# Patient Record
Sex: Female | Born: 1975 | Race: White | Hispanic: No | Marital: Married | Smoking: Never smoker
Health system: Southern US, Community
[De-identification: ages and names within clinical notes are randomized; demographics above are authoritative.]

## PROBLEM LIST (undated history)

## (undated) DIAGNOSIS — M40209 Unspecified kyphosis, site unspecified: Secondary | ICD-10-CM

## (undated) DIAGNOSIS — M419 Scoliosis, unspecified: Secondary | ICD-10-CM

## (undated) DIAGNOSIS — F909 Attention-deficit hyperactivity disorder, unspecified type: Secondary | ICD-10-CM

## (undated) DIAGNOSIS — H04129 Dry eye syndrome of unspecified lacrimal gland: Secondary | ICD-10-CM

## (undated) DIAGNOSIS — F32A Depression, unspecified: Secondary | ICD-10-CM

## (undated) DIAGNOSIS — F329 Major depressive disorder, single episode, unspecified: Secondary | ICD-10-CM

## (undated) DIAGNOSIS — J45909 Unspecified asthma, uncomplicated: Secondary | ICD-10-CM

## (undated) HISTORY — DX: Attention-deficit hyperactivity disorder, unspecified type: F90.9

## (undated) HISTORY — DX: Major depressive disorder, single episode, unspecified: F32.9

## (undated) HISTORY — DX: Unspecified kyphosis, site unspecified: M40.209

## (undated) HISTORY — DX: Scoliosis, unspecified: M41.9

## (undated) HISTORY — DX: Unspecified asthma, uncomplicated: J45.909

## (undated) HISTORY — DX: Dry eye syndrome of unspecified lacrimal gland: H04.129

## (undated) HISTORY — DX: Depression, unspecified: F32.A

---

## 2005-01-14 ENCOUNTER — Other Ambulatory Visit: Admission: RE | Admit: 2005-01-14 | Discharge: 2005-01-14 | Payer: Self-pay | Admitting: Obstetrics and Gynecology

## 2009-05-03 ENCOUNTER — Inpatient Hospital Stay (HOSPITAL_COMMUNITY): Admission: RE | Admit: 2009-05-03 | Discharge: 2009-05-06 | Payer: Self-pay | Admitting: Obstetrics and Gynecology

## 2010-04-30 LAB — CBC
HCT: 31.9 % — ABNORMAL LOW (ref 36.0–46.0)
Hemoglobin: 11.3 g/dL — ABNORMAL LOW (ref 12.0–15.0)
Hemoglobin: 13.1 g/dL (ref 12.0–15.0)
MCHC: 34.9 g/dL (ref 30.0–36.0)
MCV: 97.3 fL (ref 78.0–100.0)
Platelets: 147 10*3/uL — ABNORMAL LOW (ref 150–400)
RBC: 3.28 MIL/uL — ABNORMAL LOW (ref 3.87–5.11)
RBC: 3.86 MIL/uL — ABNORMAL LOW (ref 3.87–5.11)
WBC: 8.6 10*3/uL (ref 4.0–10.5)
WBC: 9.3 10*3/uL (ref 4.0–10.5)

## 2010-04-30 LAB — RPR: RPR Ser Ql: NONREACTIVE

## 2015-09-25 ENCOUNTER — Encounter: Payer: Self-pay | Admitting: Physician Assistant

## 2015-09-25 ENCOUNTER — Ambulatory Visit: Payer: Self-pay | Admitting: Physician Assistant

## 2015-09-25 VITALS — BP 120/88 | HR 82 | Temp 98.8°F

## 2015-09-25 DIAGNOSIS — J209 Acute bronchitis, unspecified: Secondary | ICD-10-CM

## 2015-09-25 MED ORDER — METHYLPREDNISOLONE 4 MG PO TBPK: ORAL_TABLET | ORAL | 0 refills | Status: DC

## 2015-09-25 NOTE — Progress Notes (Signed)
S: c/o some cough and congestion with ?wheezing last night, sx for 4 days, used an old inhaler and felt better, no cp/sob, no edema in legs/ankles/feet; thought it was allergies at first, just had a bad night last night  O: vitals wnl, nad, ENT wnl neck supple no lymph, lungs c t a, cv rrr  A: acute bronchitis  P: continue inhaler, add medrol dose pack, if not improved in a few days will call in an antibiotic

## 2015-09-26 ENCOUNTER — Other Ambulatory Visit: Payer: Self-pay | Admitting: Physician Assistant

## 2015-09-26 MED ORDER — ALBUTEROL SULFATE HFA 108 (90 BASE) MCG/ACT IN AERS: 2.0000 | INHALATION_SPRAY | Freq: Four times a day (QID) | RESPIRATORY_TRACT | 0 refills | Status: DC | PRN

## 2015-09-26 NOTE — Telephone Encounter (Signed)
Pt called and needs inhaler, rx sent to [facility]

## 2015-10-03 ENCOUNTER — Encounter: Payer: Self-pay | Admitting: Physician Assistant

## 2015-10-03 ENCOUNTER — Ambulatory Visit
Admission: RE | Admit: 2015-10-03 | Discharge: 2015-10-03 | Disposition: A | Discharge status: Home / Self Care | Payer: 59 | Source: Ambulatory Visit | Attending: Physician Assistant | Admitting: Physician Assistant

## 2015-10-03 ENCOUNTER — Ambulatory Visit: Payer: Self-pay | Admitting: Physician Assistant

## 2015-10-03 VITALS — BP 120/82 | HR 76 | Temp 98.5°F

## 2015-10-03 DIAGNOSIS — R059 Cough, unspecified: Secondary | ICD-10-CM

## 2015-10-03 DIAGNOSIS — R05 Cough: Secondary | ICD-10-CM

## 2015-10-03 DIAGNOSIS — J209 Acute bronchitis, unspecified: Secondary | ICD-10-CM

## 2015-10-03 MED ORDER — FLUTICASONE-SALMETEROL 115-21 MCG/ACT IN AERO: 2.0000 | INHALATION_SPRAY | Freq: Two times a day (BID) | RESPIRATORY_TRACT | 12 refills | Status: DC

## 2015-10-03 MED ORDER — AZITHROMYCIN 250 MG PO TABS: ORAL_TABLET | ORAL | 0 refills | Status: DC

## 2015-10-03 MED ORDER — PREDNISONE 10 MG (48) PO TBPK: ORAL_TABLET | Freq: Every day | ORAL | 0 refills | Status: DC

## 2015-10-03 MED ORDER — IPRATROPIUM-ALBUTEROL 0.5-2.5 (3) MG/3ML IN SOLN: 3.0000 mL | Freq: Four times a day (QID) | RESPIRATORY_TRACT | Status: DC

## 2015-10-03 NOTE — Addendum Note (Signed)
Addended by: Versie Starks on: 10/03/2015 12:11 PM   Modules accepted: Orders

## 2015-10-03 NOTE — Progress Notes (Addendum)
S: C/o cough and congestion with wheezing, hears crackling on left side when she lies down, finished steroids, denies  fever, chills, states cough is dry and hacking; keeping pt awake at night;  denies cardiac type chest pain or sob, v/d, abd pain Remainder ros neg  O: vitals wnl, nad, , neck supple no lymph, lungs with wheezing, cv rrr, neuro intact, svn duoneb given, still wheezing  A:  Acute bronchitis, ?pneumonia   P:  Cxr,  use otc meds, tylenol or motrin as needed for fever/chills, return if not better in 3 -5 days, return earlier if worsening, will call inmeds after cxr result  Xray was normal, sent rx for zpack, advair, and sterapred, pt to return in 2 days for recheck

## 2015-11-06 DIAGNOSIS — Z30433 Encounter for removal and reinsertion of intrauterine contraceptive device: Secondary | ICD-10-CM | POA: Diagnosis not present

## 2015-11-06 DIAGNOSIS — Z3200 Encounter for pregnancy test, result unknown: Secondary | ICD-10-CM | POA: Diagnosis not present

## 2015-11-27 DIAGNOSIS — Z01419 Encounter for gynecological examination (general) (routine) without abnormal findings: Secondary | ICD-10-CM | POA: Diagnosis not present

## 2015-11-27 DIAGNOSIS — Z6833 Body mass index (BMI) 33.0-33.9, adult: Secondary | ICD-10-CM | POA: Diagnosis not present

## 2015-12-21 DIAGNOSIS — Z131 Encounter for screening for diabetes mellitus: Secondary | ICD-10-CM | POA: Diagnosis not present

## 2015-12-21 DIAGNOSIS — Z1321 Encounter for screening for nutritional disorder: Secondary | ICD-10-CM | POA: Diagnosis not present

## 2015-12-21 DIAGNOSIS — Z13220 Encounter for screening for lipoid disorders: Secondary | ICD-10-CM | POA: Diagnosis not present

## 2015-12-21 DIAGNOSIS — Z1329 Encounter for screening for other suspected endocrine disorder: Secondary | ICD-10-CM | POA: Diagnosis not present

## 2016-05-06 DIAGNOSIS — R079 Chest pain, unspecified: Secondary | ICD-10-CM | POA: Diagnosis not present

## 2016-05-06 DIAGNOSIS — R1084 Generalized abdominal pain: Secondary | ICD-10-CM | POA: Diagnosis not present

## 2016-05-22 ENCOUNTER — Ambulatory Visit (INDEPENDENT_AMBULATORY_CARE_PROVIDER_SITE_OTHER): Payer: 59

## 2016-05-22 ENCOUNTER — Ambulatory Visit (INDEPENDENT_AMBULATORY_CARE_PROVIDER_SITE_OTHER): Payer: 59 | Admitting: Orthopedic Surgery

## 2016-05-22 ENCOUNTER — Encounter (INDEPENDENT_AMBULATORY_CARE_PROVIDER_SITE_OTHER): Payer: Self-pay | Admitting: Orthopedic Surgery

## 2016-05-22 DIAGNOSIS — M25561 Pain in right knee: Secondary | ICD-10-CM | POA: Diagnosis not present

## 2016-05-22 DIAGNOSIS — M546 Pain in thoracic spine: Secondary | ICD-10-CM | POA: Diagnosis not present

## 2016-05-24 NOTE — Progress Notes (Signed)
Office Visit Note   Patient: Sharon Carey           Date of Birth: [birth year]           MRN: [id number] Visit Date: 05/22/2016 Requested by: No referring provider defined for this encounter. PCP: No PCP Per Patient  Subjective: Chief Complaint  Patient presents with  . Right Knee - Pain  . Middle Back - Pain    HPI: Sharon Carey is a 41 year old patient with a year history of intermittent right-sided mid thoracic back pain.  This started years ago when she was bending over trying to pull her boots up and she felt a pop in her back.  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  At times she feels pulling when she turns to the right.  She takes ibuprofen to a day.  She does describe some numbness around her neck.  She has significant pain in the side whenever she turns or twists.  Whenever she coughs or sneezes she has increased pain.  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  Unclear she's ever had an MRI scan.  Symptoms been worse for the past year but have been present for 2 years.  Patient also describes on off right knee pain which comes and goes without any discrete injury.  She describes swelling but no weakness giving way locking or popping.  Showing reports pain with certain movement.              ROS: All systems reviewed are negative as they relate to the chief complaint within the history of present illness.  Patient denies  fevers or chills.   Assessment & Plan: Visit Diagnoses:  1. Pain in thoracic spine   2. Right knee pain, unspecified chronicity     Plan:  Impression is right-sided thoracic pain with fairly significant kyphosis on lateral radiograph.  I think she's having enough radiating type pain that she would potentially benefit from injections.  Plan is for MRI thoracic spine to evaluate right-sided disc.  She is also having a lot of chest wall pain to direct palpation.  This is been going on  over a year and with normal radiographs in that area I would favor chest CT to evaluate that chest wall region.  Hard to know if this is just some type of atypical rib subluxation long-standing muscle sprain versus strain or a thoracic disc on the right-hand side.  She localizes the pain most discretely on the lateral side of the lower rib cage.   Follow-Up Instructions: No Follow-up on file.   Orders:  Orders Placed This Encounter  Procedures  . XR KNEE 3 VIEW RIGHT  . XR Thoracic Spine 2 View  . CT CHEST WO CONTRAST  . MR THORACIC SPINE WO CONTRAST   No orders of the defined types were placed in this encounter.     Procedures: No procedures performed   Clinical Data: No additional findings.  Objective: Vital Signs: There were no vitals taken for this visit.  Physical Exam:  Constitutional: Patient appears well-developed HEENT:  Head: Normocephalic Eyes:EOM are normal Neck: Normal range of motion Cardiovascular: Normal rate Pulmonary/chest: Effort normal Neurologic: Patient is alert Skin: Skin is warm Psychiatric: Patient has normal mood and affect   Ortho Exam: Orthopedic exam demonstrates reasonable right knee range of motion with stable clamp initially was no effusion no joint line tenderness extensor mechanism is nontender  and intact pedal pulses palpable no other masses lymph adenopathy or skin changes noted in the right knee region  Patient has no Babinski no clonus.  Palpable pedal pulses.  Some pain with forward lateral bending.  No skin changes or rashes on the back.  Does have tenderness to direct palpation around the right lateral ninth and 10th rib.  No scapular dyskinesia with forward flexion.  Specialty Comments:  No specialty comments available.  Imaging: No results found.   PMFS History: There are no active problems to display for this patient.  No past medical history on file.  No family history on file.  No past surgical history on  file. Social History   Occupational History  . Not on file.   Social History Main Topics  . Smoking status: Never Smoker  . Smokeless tobacco: Never Used  . Alcohol use Not on file  . Drug use: Unknown  . Sexual activity: Not on file

## 2016-06-03 ENCOUNTER — Ambulatory Visit
Admission: RE | Admit: 2016-06-03 | Discharge: 2016-06-03 | Disposition: A | Discharge status: Home / Self Care | Payer: 59 | Source: Ambulatory Visit | Attending: Orthopedic Surgery | Admitting: Orthopedic Surgery

## 2016-06-03 DIAGNOSIS — M546 Pain in thoracic spine: Secondary | ICD-10-CM

## 2016-06-03 DIAGNOSIS — R079 Chest pain, unspecified: Secondary | ICD-10-CM | POA: Diagnosis not present

## 2016-06-03 DIAGNOSIS — M4184 Other forms of scoliosis, thoracic region: Secondary | ICD-10-CM | POA: Diagnosis not present

## 2016-06-10 ENCOUNTER — Ambulatory Visit (INDEPENDENT_AMBULATORY_CARE_PROVIDER_SITE_OTHER): Payer: 59 | Admitting: Orthopedic Surgery

## 2016-06-10 DIAGNOSIS — R591 Generalized enlarged lymph nodes: Secondary | ICD-10-CM | POA: Diagnosis not present

## 2016-06-10 DIAGNOSIS — M546 Pain in thoracic spine: Secondary | ICD-10-CM

## 2016-06-12 ENCOUNTER — Encounter (INDEPENDENT_AMBULATORY_CARE_PROVIDER_SITE_OTHER): Payer: Self-pay | Admitting: Orthopedic Surgery

## 2016-06-12 ENCOUNTER — Other Ambulatory Visit: Payer: Self-pay | Admitting: Obstetrics and Gynecology

## 2016-06-12 DIAGNOSIS — R591 Generalized enlarged lymph nodes: Secondary | ICD-10-CM

## 2016-06-12 DIAGNOSIS — N63 Unspecified lump in unspecified breast: Secondary | ICD-10-CM

## 2016-06-12 NOTE — Progress Notes (Signed)
   Office Visit Note   Patient: Sharon Carey           Date of Birth: [birth year]           MRN: [id number] Visit Date: 06/10/2016 Requested by: No referring provider defined for this encounter. PCP: Louretta Shorten, MD  Subjective: Chief Complaint  Patient presents with  . Middle Back - Pain    HPI: Sharon Carey is a 41 year old female with back pain and right-sided chest pain.  Since of Sharon Carey she's had MRI of the thoracic spine.  That scan is reviewed with her today and it does show T5-6 H&P.  CT scan also shows some lymphadenopathy in the axillary region and breast workup is recommended.  She does have some pain in that chest wall with sleep.  CT scan was otherwise negative for any type of rib fracture.  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  Patient denies  fevers or chills.   Assessment & Plan: Visit Diagnoses:  1. Pain in thoracic spine   2. Lymphadenopathy     Plan: Impression is right sided T5-6 H&P which I think is giving her her describes symptoms.  We reviewed the MRI scan.  She could consider epidural start injections and if she wants to do that she will call and I can arrange this with Dr. Marlou Sa.  More pressing is mammogram and breast workup for this unexplained lymphadenopathy in the chest wall area.  I will see her back as needed we are going to send her to Dr. Natale Milch for this evaluation.  He is her primary care provider.  Follow-Up Instructions: Return if symptoms worsen or fail to improve.   Orders:  Orders Placed This Encounter  Procedures  . Ambulatory referral to Gynecology   No orders of the defined types were placed in this encounter.     Procedures: No procedures performed   Clinical Data: No additional findings.  Objective: Vital Signs: There were no vitals taken for this visit.  Physical Exam:   Constitutional:  Patient appears well-developed HEENT:  Head: Normocephalic Eyes:EOM are normal Neck: Normal range of motion Cardiovascular: Normal rate Pulmonary/chest: Effort normal Neurologic: Patient is alert Skin: Skin is warm Psychiatric: Patient has normal mood and affect    Ortho Exam: Orthopedic exam demonstrates pain with forward lateral bending no rashes motor sensory function to the legs is intact rest of her examination is unchanged  Specialty Comments:  No specialty comments available.  Imaging: No results found.   PMFS History: There are no active problems to display for this patient.  No past medical history on file.  No family history on file.  No past surgical history on file. Social History   Occupational History  . Not on file.   Social History Main Topics  . Smoking status: Never Smoker  . Smokeless tobacco: Never Used  . Alcohol use Not on file  . Drug use: Unknown  . Sexual activity: Not on file

## 2016-06-14 ENCOUNTER — Ambulatory Visit
Admission: RE | Admit: 2016-06-14 | Discharge: 2016-06-14 | Disposition: A | Discharge status: Home / Self Care | Payer: 59 | Source: Ambulatory Visit | Attending: Obstetrics and Gynecology | Admitting: Obstetrics and Gynecology

## 2016-06-14 ENCOUNTER — Other Ambulatory Visit: Payer: Self-pay | Admitting: Obstetrics and Gynecology

## 2016-06-14 DIAGNOSIS — N6489 Other specified disorders of breast: Secondary | ICD-10-CM | POA: Diagnosis not present

## 2016-06-14 DIAGNOSIS — R591 Generalized enlarged lymph nodes: Secondary | ICD-10-CM

## 2016-06-14 DIAGNOSIS — R599 Enlarged lymph nodes, unspecified: Secondary | ICD-10-CM

## 2016-06-14 DIAGNOSIS — R928 Other abnormal and inconclusive findings on diagnostic imaging of breast: Secondary | ICD-10-CM | POA: Diagnosis not present

## 2016-06-20 ENCOUNTER — Other Ambulatory Visit: Payer: Self-pay | Admitting: Obstetrics and Gynecology

## 2016-06-20 ENCOUNTER — Ambulatory Visit
Admission: RE | Admit: 2016-06-20 | Discharge: 2016-06-20 | Disposition: A | Discharge status: Home / Self Care | Payer: 59 | Source: Ambulatory Visit | Attending: Obstetrics and Gynecology | Admitting: Obstetrics and Gynecology

## 2016-06-20 DIAGNOSIS — R590 Localized enlarged lymph nodes: Secondary | ICD-10-CM | POA: Diagnosis not present

## 2016-06-20 DIAGNOSIS — R599 Enlarged lymph nodes, unspecified: Secondary | ICD-10-CM

## 2016-06-21 ENCOUNTER — Other Ambulatory Visit (HOSPITAL_COMMUNITY)
Admission: RE | Admit: 2016-06-21 | Discharge: 2016-06-21 | Disposition: A | Discharge status: Home / Self Care | Payer: 59 | Source: Other Acute Inpatient Hospital | Attending: Diagnostic Radiology | Admitting: Diagnostic Radiology

## 2016-06-21 DIAGNOSIS — R590 Localized enlarged lymph nodes: Secondary | ICD-10-CM | POA: Diagnosis not present

## 2016-08-13 ENCOUNTER — Other Ambulatory Visit: Payer: Self-pay | Admitting: Obstetrics and Gynecology

## 2016-08-13 DIAGNOSIS — R590 Localized enlarged lymph nodes: Secondary | ICD-10-CM

## 2016-09-27 DIAGNOSIS — H16223 Keratoconjunctivitis sicca, not specified as Sjogren's, bilateral: Secondary | ICD-10-CM | POA: Diagnosis not present

## 2016-10-03 DIAGNOSIS — H16223 Keratoconjunctivitis sicca, not specified as Sjogren's, bilateral: Secondary | ICD-10-CM | POA: Diagnosis not present

## 2016-12-03 DIAGNOSIS — Z01419 Encounter for gynecological examination (general) (routine) without abnormal findings: Secondary | ICD-10-CM | POA: Diagnosis not present

## 2016-12-03 DIAGNOSIS — H04129 Dry eye syndrome of unspecified lacrimal gland: Secondary | ICD-10-CM | POA: Diagnosis not present

## 2016-12-03 DIAGNOSIS — Z6832 Body mass index (BMI) 32.0-32.9, adult: Secondary | ICD-10-CM | POA: Diagnosis not present

## 2017-01-02 ENCOUNTER — Other Ambulatory Visit: Payer: Self-pay

## 2017-01-02 DIAGNOSIS — H16223 Keratoconjunctivitis sicca, not specified as Sjogren's, bilateral: Secondary | ICD-10-CM | POA: Diagnosis not present

## 2017-01-03 ENCOUNTER — Ambulatory Visit
Admission: RE | Admit: 2017-01-03 | Discharge: 2017-01-03 | Disposition: A | Discharge status: Home / Self Care | Payer: 59 | Source: Ambulatory Visit | Attending: Obstetrics and Gynecology | Admitting: Obstetrics and Gynecology

## 2017-01-03 DIAGNOSIS — R590 Localized enlarged lymph nodes: Secondary | ICD-10-CM

## 2017-01-03 DIAGNOSIS — N6489 Other specified disorders of breast: Secondary | ICD-10-CM | POA: Diagnosis not present

## 2017-05-15 ENCOUNTER — Encounter: Payer: Self-pay | Admitting: Family Medicine

## 2017-05-15 ENCOUNTER — Ambulatory Visit: Payer: 59 | Admitting: Family Medicine

## 2017-05-15 VITALS — BP 118/80 | HR 90 | Ht 63.39 in | Wt 187.5 lb

## 2017-05-15 DIAGNOSIS — J4520 Mild intermittent asthma, uncomplicated: Secondary | ICD-10-CM

## 2017-05-15 DIAGNOSIS — Z0000 Encounter for general adult medical examination without abnormal findings: Secondary | ICD-10-CM | POA: Insufficient documentation

## 2017-05-15 DIAGNOSIS — J301 Allergic rhinitis due to pollen: Secondary | ICD-10-CM | POA: Diagnosis not present

## 2017-05-15 DIAGNOSIS — Z0001 Encounter for general adult medical examination with abnormal findings: Secondary | ICD-10-CM | POA: Diagnosis not present

## 2017-05-15 DIAGNOSIS — J45909 Unspecified asthma, uncomplicated: Secondary | ICD-10-CM | POA: Insufficient documentation

## 2017-05-15 DIAGNOSIS — R079 Chest pain, unspecified: Secondary | ICD-10-CM | POA: Insufficient documentation

## 2017-05-15 DIAGNOSIS — M546 Pain in thoracic spine: Secondary | ICD-10-CM

## 2017-05-15 DIAGNOSIS — D229 Melanocytic nevi, unspecified: Secondary | ICD-10-CM | POA: Diagnosis not present

## 2017-05-15 MED ORDER — ALBUTEROL SULFATE HFA 108 (90 BASE) MCG/ACT IN AERS: 2.0000 | INHALATION_SPRAY | Freq: Four times a day (QID) | RESPIRATORY_TRACT | 3 refills | Status: DC | PRN

## 2017-05-15 MED ORDER — FLUTICASONE PROPIONATE 50 MCG/ACT NA SUSP: 2.0000 | Freq: Every day | NASAL | 6 refills | Status: DC

## 2017-05-15 MED ORDER — METHOCARBAMOL 500 MG PO TABS: 500.0000 mg | ORAL_TABLET | Freq: Three times a day (TID) | ORAL | 0 refills | Status: DC | PRN

## 2017-05-15 NOTE — Patient Instructions (Signed)
Back Exercises The following exercises strengthen the muscles that help to support the back. They also help to keep the lower back flexible. Doing these exercises can help to prevent back pain or lessen existing pain. If you have back pain or discomfort, try doing these exercises 2-3 times each day or as told by your health care provider. When the pain goes away, do them once each day, but increase the number of times that you repeat the steps for each exercise (do more repetitions). If you do not have back pain or discomfort, do these exercises once each day or as told by your health care provider. Exercises Single Knee to Chest  Repeat these steps 3-5 times for each leg: 1. Lie on your back on a firm bed or the floor with your legs extended. 2. Bring one knee to your chest. Your other leg should stay extended and in contact with the floor. 3. Hold your knee in place by grabbing your knee or thigh. 4. Pull on your knee until you feel a gentle stretch in your lower back. 5. Hold the stretch for 10-30 seconds. 6. Slowly release and straighten your leg.  Pelvic Tilt  Repeat these steps 5-10 times: 1. Lie on your back on a firm bed or the floor with your legs extended. 2. Bend your knees so they are pointing toward the ceiling and your feet are flat on the floor. 3. Tighten your lower abdominal muscles to press your lower back against the floor. This motion will tilt your pelvis so your tailbone points up toward the ceiling instead of pointing to your feet or the floor. 4. With gentle tension and even breathing, hold this position for 5-10 seconds.  Cat-Cow  Repeat these steps until your lower back becomes more flexible: 1. Get into a hands-and-knees position on a firm surface. Keep your hands under your shoulders, and keep your knees under your hips. You may place padding under your knees for comfort. 2. Let your head hang down, and point your tailbone toward the floor so your lower back  becomes rounded like the back of a cat. 3. Hold this position for 5 seconds. 4. Slowly lift your head and point your tailbone up toward the ceiling so your back forms a sagging arch like the back of a cow. 5. Hold this position for 5 seconds.  Press-Ups  Repeat these steps 5-10 times: 1. Lie on your abdomen (face-down) on the floor. 2. Place your palms near your head, about shoulder-width apart. 3. While you keep your back as relaxed as possible and keep your hips on the floor, slowly straighten your arms to raise the top half of your body and lift your shoulders. Do not use your back muscles to raise your upper torso. You may adjust the placement of your hands to make yourself more comfortable. 4. Hold this position for 5 seconds while you keep your back relaxed. 5. Slowly return to lying flat on the floor.  Bridges  Repeat these steps 10 times: 1. Lie on your back on a firm surface. 2. Bend your knees so they are pointing toward the ceiling and your feet are flat on the floor. 3. Tighten your buttocks muscles and lift your buttocks off of the floor until your waist is at almost the same height as your knees. You should feel the muscles working in your buttocks and the back of your thighs. If you do not feel these muscles, slide your feet 1-2 inches farther away  from your buttocks. 4. Hold this position for 3-5 seconds. 5. Slowly lower your hips to the starting position, and allow your buttocks muscles to relax completely.  If this exercise is too easy, try doing it with your arms crossed over your chest. Abdominal Crunches  Repeat these steps 5-10 times: 1. Lie on your back on a firm bed or the floor with your legs extended. 2. Bend your knees so they are pointing toward the ceiling and your feet are flat on the floor. 3. Cross your arms over your chest. 4. Tip your chin slightly toward your chest without bending your neck. 5. Tighten your abdominal muscles and slowly raise your  trunk (torso) high enough to lift your shoulder blades a tiny bit off of the floor. Avoid raising your torso higher than that, because it can put too much stress on your low back and it does not help to strengthen your abdominal muscles. 6. Slowly return to your starting position.  Back Lifts Repeat these steps 5-10 times: 1. Lie on your abdomen (face-down) with your arms at your sides, and rest your forehead on the floor. 2. Tighten the muscles in your legs and your buttocks. 3. Slowly lift your chest off of the floor while you keep your hips pressed to the floor. Keep the back of your head in line with the curve in your back. Your eyes should be looking at the floor. 4. Hold this position for 3-5 seconds. 5. Slowly return to your starting position.  Contact a health care provider if:  Your back pain or discomfort gets much worse when you do an exercise.  Your back pain or discomfort does not lessen within 2 hours after you exercise. If you have any of these problems, stop doing these exercises right away. Do not do them again unless your health care provider says that you can. Get help right away if:  You develop sudden, severe back pain. If this happens, stop doing the exercises right away. Do not do them again unless your health care provider says that you can. This information is not intended to replace advice given to you by your health care provider. Make sure you discuss any questions you have with your health care provider. Document Released: 02/29/2004 Document Revised: 05/31/2015 Document Reviewed: 03/17/2014 Elsevier Interactive Patient Education  2017 [place] Maintenance, Female Adopting a healthy lifestyle and getting preventive care can go a long way to promote health and wellness. Talk with your health care provider about what schedule of regular examinations is right for you. This is a good chance for you to check in with your provider about disease prevention  and staying healthy. In between checkups, there are plenty of things you can do on your own. Experts have done a lot of research about which lifestyle changes and preventive measures are most likely to keep you healthy. Ask your health care provider for more information. Weight and diet Eat a healthy diet  Be sure to include plenty of vegetables, fruits, low-fat dairy products, and lean protein.  Do not eat a lot of foods high in solid fats, added sugars, or salt.  Get regular exercise. This is one of the most important things you can do for your health. ? Most adults should exercise for at least 150 minutes each week. The exercise should increase your heart rate and make you sweat (moderate-intensity exercise). ? Most adults should also do strengthening exercises at least twice a week. This is in addition  to the moderate-intensity exercise.  Maintain a healthy weight  Body mass index (BMI) is a measurement that can be used to identify possible weight problems. It estimates body fat based on height and weight. Your health care provider can help determine your BMI and help you achieve or maintain a healthy weight.  For females 8 years of age and older: ? A BMI below 18.5 is considered underweight. ? A BMI of 18.5 to 24.9 is normal. ? A BMI of 25 to 29.9 is considered overweight. ? A BMI of 30 and above is considered obese.  Watch levels of cholesterol and blood lipids  You should start having your blood tested for lipids and cholesterol at 41 years of age, then have this test every 5 years.  You may need to have your cholesterol levels checked more often if: ? Your lipid or cholesterol levels are high. ? You are older than 41 years of age. ? You are at high risk for heart disease.  Cancer screening Lung Cancer  Lung cancer screening is recommended for adults 54-26 years old who are at high risk for lung cancer because of a history of smoking.  A yearly low-dose CT scan of the  lungs is recommended for people who: ? Currently smoke. ? Have quit within the past 15 years. ? Have at least a 30-pack-year history of smoking. A pack year is smoking an average of one pack of cigarettes a day for 1 year.  Yearly screening should continue until it has been 15 years since you quit.  Yearly screening should stop if you develop a health problem that would prevent you from having lung cancer treatment.  Breast Cancer  Practice breast self-awareness. This means understanding how your breasts normally appear and feel.  It also means doing regular breast self-exams. Let your health care provider know about any changes, no matter how small.  If you are in your 20s or 30s, you should have a clinical breast exam (CBE) by a health care provider every 1-3 years as part of a regular health exam.  If you are 6 or older, have a CBE every year. Also consider having a breast X-ray (mammogram) every year.  If you have a family history of breast cancer, talk to your health care provider about genetic screening.  If you are at high risk for breast cancer, talk to your health care provider about having an MRI and a mammogram every year.  Breast cancer gene (BRCA) assessment is recommended for women who have family members with BRCA-related cancers. BRCA-related cancers include: ? Breast. ? Ovarian. ? Tubal. ? Peritoneal cancers.  Results of the assessment will determine the need for genetic counseling and BRCA1 and BRCA2 testing.  Cervical Cancer Your health care provider may recommend that you be screened regularly for cancer of the pelvic organs (ovaries, uterus, and vagina). This screening involves a pelvic examination, including checking for microscopic changes to the surface of your cervix (Pap test). You may be encouraged to have this screening done every 3 years, beginning at age 75.  For women ages 35-65, health care providers may recommend pelvic exams and Pap testing every 3  years, or they may recommend the Pap and pelvic exam, combined with testing for human papilloma virus (HPV), every 5 years. Some types of HPV increase your risk of cervical cancer. Testing for HPV may also be done on women of any age with unclear Pap test results.  Other health care providers may not recommend  any screening for nonpregnant women who are considered low risk for pelvic cancer and who do not have symptoms. Ask your health care provider if a screening pelvic exam is right for you.  If you have had past treatment for cervical cancer or a condition that could lead to cancer, you need Pap tests and screening for cancer for at least 20 years after your treatment. If Pap tests have been discontinued, your risk factors (such as having a new sexual partner) need to be reassessed to determine if screening should resume. Some women have medical problems that increase the chance of getting cervical cancer. In these cases, your health care provider may recommend more frequent screening and Pap tests.  Colorectal Cancer  This type of cancer can be detected and often prevented.  Routine colorectal cancer screening usually begins at 41 years of age and continues through 41 years of age.  Your health care provider may recommend screening at an earlier age if you have risk factors for colon cancer.  Your health care provider may also recommend using home test kits to check for hidden blood in the stool.  A small camera at the end of a tube can be used to examine your colon directly (sigmoidoscopy or colonoscopy). This is done to check for the earliest forms of colorectal cancer.  Routine screening usually begins at age 34.  Direct examination of the colon should be repeated every 5-10 years through 41 years of age. However, you may need to be screened more often if early forms of precancerous polyps or small growths are found.  Skin Cancer  Check your skin from head to toe regularly.  Tell  your health care provider about any new moles or changes in moles, especially if there is a change in a mole's shape or color.  Also tell your health care provider if you have a mole that is larger than the size of a pencil eraser.  Always use sunscreen. Apply sunscreen liberally and repeatedly throughout the day.  Protect yourself by wearing long sleeves, pants, a wide-brimmed hat, and sunglasses whenever you are outside.  Heart disease, diabetes, and high blood pressure  High blood pressure causes heart disease and increases the risk of stroke. High blood pressure is more likely to develop in: ? People who have blood pressure in the high end of the normal range (130-139/85-89 mm Hg). ? People who are overweight or obese. ? People who are African American.  If you are 31-44 years of age, have your blood pressure checked every 3-5 years. If you are 72 years of age or older, have your blood pressure checked every year. You should have your blood pressure measured twice-once when you are at a hospital or clinic, and once when you are not at a hospital or clinic. Record the average of the two measurements. To check your blood pressure when you are not at a hospital or clinic, you can use: ? An automated blood pressure machine at a pharmacy. ? A home blood pressure monitor.  If you are between 65 years and 69 years old, ask your health care provider if you should take aspirin to prevent strokes.  Have regular diabetes screenings. This involves taking a blood sample to check your fasting blood sugar level. ? If you are at a normal weight and have a low risk for diabetes, have this test once every three years after 41 years of age. ? If you are overweight and have a high risk for  diabetes, consider being tested at a younger age or more often. Preventing infection Hepatitis B  If you have a higher risk for hepatitis B, you should be screened for this virus. You are considered at high risk for  hepatitis B if: ? You were born in a country where hepatitis B is common. Ask your health care provider which countries are considered high risk. ? Your parents were born in a high-risk country, and you have not been immunized against hepatitis B (hepatitis B vaccine). ? You have HIV or AIDS. ? You use needles to inject street drugs. ? You live with someone who has hepatitis B. ? You have had sex with someone who has hepatitis B. ? You get hemodialysis treatment. ? You take certain medicines for conditions, including cancer, organ transplantation, and autoimmune conditions.  Hepatitis C  Blood testing is recommended for: ? Everyone born from [birth year] through 1964. ? Anyone with known risk factors for hepatitis C.  Sexually transmitted infections (STIs)  You should be screened for sexually transmitted infections (STIs) including gonorrhea and chlamydia if: ? You are sexually active and are younger than 41 years of age. ? You are older than 41 years of age and your health care provider tells you that you are at risk for this type of infection. ? Your sexual activity has changed since you were last screened and you are at an increased risk for chlamydia or gonorrhea. Ask your health care provider if you are at risk.  If you do not have HIV, but are at risk, it may be recommended that you take a prescription medicine daily to prevent HIV infection. This is called pre-exposure prophylaxis (PrEP). You are considered at risk if: ? You are sexually active and do not regularly use condoms or know the HIV status of your partner(s). ? You take drugs by injection. ? You are sexually active with a partner who has HIV.  Talk with your health care provider about whether you are at high risk of being infected with HIV. If you choose to begin PrEP, you should first be tested for HIV. You should then be tested every 3 months for as long as you are taking PrEP. Pregnancy  If you are premenopausal and you may  become pregnant, ask your health care provider about preconception counseling.  If you may become pregnant, take 400 to 800 micrograms (mcg) of folic acid every day.  If you want to prevent pregnancy, talk to your health care provider about birth control (contraception). Osteoporosis and menopause  Osteoporosis is a disease in which the bones lose minerals and strength with aging. This can result in serious bone fractures. Your risk for osteoporosis can be identified using a bone density scan.  If you are 71 years of age or older, or if you are at risk for osteoporosis and fractures, ask your health care provider if you should be screened.  Ask your health care provider whether you should take a calcium or vitamin D supplement to lower your risk for osteoporosis.  Menopause may have certain physical symptoms and risks.  Hormone replacement therapy may reduce some of these symptoms and risks. Talk to your health care provider about whether hormone replacement therapy is right for you. Follow these instructions at home:  Schedule regular health, dental, and eye exams.  Stay current with your immunizations.  Do not use any tobacco products including cigarettes, chewing tobacco, or electronic cigarettes.  If you are pregnant, do not drink alcohol.  If you are breastfeeding, limit how much and how often you drink alcohol.  Limit alcohol intake to no more than 1 drink per day for nonpregnant women. One drink equals 12 ounces of beer, 5 ounces of wine, or 1 ounces of hard liquor.  Do not use street drugs.  Do not share needles.  Ask your health care provider for help if you need support or information about quitting drugs.  Tell your health care provider if you often feel depressed.  Tell your health care provider if you have ever been abused or do not feel safe at home. This information is not intended to replace advice given to you by your health care provider. Make sure you  discuss any questions you have with your health care provider. Document Released: 08/06/2010 Document Revised: 06/29/2015 Document Reviewed: 10/25/2014 Elsevier Interactive Patient Education  2018 [employer].  How to Increase Your Level of Physical Activity Getting regular physical activity is important for your overall health and well-being. Most people do not get enough exercise. There are easy ways to increase your level of physical activity, even if you have not been very active in the past or you are just starting out. Why is physical activity important? Physical activity has many short-term and long-term health benefits. Regular exercise can:  Help you lose weight or maintain a healthy weight.  Strengthen your muscles and bones.  Boost your mood and improve self-esteem.  Reduce your risk of certain long-term (chronic) diseases, like heart disease, cancer, and diabetes.  Help you stay capable of walking and moving around (mobile) as you age.  Prevent accidents, such as falls, as you age.  Increase life expectancy.  What are the benefits of being physically active on a regular basis? In addition to improving your physical health, being physically active on most days of the week can help you in ways that you may not expect. Benefits of regular physical activity may include:  Feeling good about your body.  Being able to move around more easily and for longer periods of time without getting tired (increased stamina).  Finding new sources of fun and enjoyment.  Meeting new people who share a common interest.  Being able to fight off illness better (enhanced immunity).  Being able to sleep better.  What can happen if I am not physically active on a regular basis? Not getting enough physical activity can lead to an unhealthy lifestyle and future health problems. This can increase your chances of:  Becoming overweight or obese.  Becoming sick.  Developing chronic  illnesses, like heart disease or diabetes.  Having mental health problems, like depression or anxiety.  Having sleep problems.  Having trouble walking or getting yourself around (reduced mobility).  Injuring yourself in a fall as you get older.  What steps can I take to be more physically active?  Check with your health care provider about how to get started. Ask your health care provider what activities are safe for you.  Start out slowly. Walking or doing some simple chair exercises is a good place to start, especially if you have not been active before or for a long time.  Try to find activities that you enjoy. You are more likely to commit to an exercise routine if it does not feel like a chore.  If you have bone or joint problems, choose low-impact exercises, like walking or swimming.  Include physical activity in your everyday routine.  Invite friends or family members to exercise with  you. This also will help you commit to your workout plan.  Set goals that you can work toward.  Aim for at least 150 minutes of moderate-intensity exercise each week. Examples of moderate-intensity exercise include walking or riding a bike. Where to find more information:  Centers for Disease Control and Prevention: [employer]is  [employer] on [employer], Sports & Nutrition www.http://villegas.org/  ChooseMyPlate: [employer] Contact a health care provider if:  You have headaches, muscle aches, or joint pain.  You feel dizzy or light-headed while exercising.  You faint.  You have chest pain while exercising. Summary  Exercise benefits your mind and body at any age, even if you are just starting out.  If you have a chronic illness or have not been active for a while, check with your health care provider before increasing your physical activity.  Choose activities that are safe and enjoyable for you.Ask your health  care provider what activities are safe for you.  Start slowly. Tell your health care provider if you have problems as you start to increase your activity level. This information is not intended to replace advice given to you by your health care provider. Make sure you discuss any questions you have with your health care provider. Document Released: 01/11/2016 Document Revised: 01/11/2016 Document Reviewed: 01/11/2016 Elsevier Interactive Patient Education  2018 [place]  Mole A mole is a colored (pigmented) growth on the skin. Moles are very common. They are usually harmless, but some moles can become cancerous over time. What are the causes? Moles occur when pigmented skin cells grow together in clusters instead of spreading out in the skin as they normally do. The reason why the skin cells grow together in clusters is not known. What are the signs or symptoms? A mole may be:  [place] or black.  Flat or raised.  Smooth or wrinkled.  How is this diagnosed? A mole is diagnosed with a skin exam. If your health care provider thinks a mole may be cancerous, a piece of the mole will be removed for testing. How is this treated? Treatment is not needed unless a mole is cancerous. If a mole is cancerous, it will be removed. If a mole is causing pain or you do not like the way it looks, you may choose to have it removed. Follow these instructions at home:  Every month, look for new moles and check your existing moles for changes. This is important because a change in a mole can mean that the mole has become cancerous. Look for changes in: ? Size. Look for moles that are more than  in (0.64 cm) wide (in diameter). ? Shape. Look for moles that are not round or oval. ? Borders. Look for moles that are not symmetrical. ? Color. Note that it is normal for moles to get darker during pregnancy or when you take birth control pills.  When you are outdoors, wear sunscreen with SPF 30 (sun  protection factor 30) or higher. Reapply the sunscreen every 2-3 hours.  If you have a large number of moles, see a skin doctor (dermatologist) at least one time every year. Contact a health care provider if:  The size, shape, borders, or color of your mole change.  Your mole, or the skin near the mole, becomes painful, sore, red, or swollen.  Your mole: ? Develops more than one color. ? Itches or bleeds. ? Becomes scaly, sheds skin, or oozes fluid. ? Becomes flat or develops raised areas. ? Becomes hard or  soft.  You develop a new mole. This information is not intended to replace advice given to you by your health care provider. Make sure you discuss any questions you have with your health care provider. Document Released: 10/16/2000 Document Revised: 07/05/2015 Document Reviewed: 11/11/2014 Elsevier Interactive Patient Education  2018 [place] Years, Female Preventive care refers to lifestyle choices and visits with your health care provider that can promote health and wellness. What does preventive care include?  A yearly physical exam. This is also called an annual well check.  Dental exams once or twice a year.  Routine eye exams. Ask your health care provider how often you should have your eyes checked.  Personal lifestyle choices, including: ? Daily care of your teeth and gums. ? Regular physical activity. ? Eating a healthy diet. ? Avoiding tobacco and drug use. ? Limiting alcohol use. ? Practicing safe sex. ? Taking low-dose aspirin daily starting at age 36. ? Taking vitamin and mineral supplements as recommended by your health care provider. What happens during an annual well check? The services and screenings done by your health care provider during your annual well check will depend on your age, overall health, lifestyle risk factors, and family history of disease. Counseling Your health care provider may ask you questions about  your:  Alcohol use.  Tobacco use.  Drug use.  Emotional well-being.  Home and relationship well-being.  Sexual activity.  Eating habits.  Work and work Statistician.  Method of birth control.  Menstrual cycle.  Pregnancy history.  Screening You may have the following tests or measurements:  Height, weight, and BMI.  Blood pressure.  Lipid and cholesterol levels. These may be checked every 5 years, or more frequently if you are over 42 years old.  Skin check.  Lung cancer screening. You may have this screening every year starting at age 28 if you have a 30-pack-year history of smoking and currently smoke or have quit within the past 15 years.  Fecal occult blood test (FOBT) of the stool. You may have this test every year starting at age 3.  Flexible sigmoidoscopy or colonoscopy. You may have a sigmoidoscopy every 5 years or a colonoscopy every 10 years starting at age 2.  Hepatitis C blood test.  Hepatitis B blood test.  Sexually transmitted disease (STD) testing.  Diabetes screening. This is done by checking your blood sugar (glucose) after you have not eaten for a while (fasting). You may have this done every 1-3 years.  Mammogram. This may be done every 1-2 years. Talk to your health care provider about when you should start having regular mammograms. This may depend on whether you have a family history of breast cancer.  BRCA-related cancer screening. This may be done if you have a family history of breast, ovarian, tubal, or peritoneal cancers.  Pelvic exam and Pap test. This may be done every 3 years starting at age 37. Starting at age 25, this may be done every 5 years if you have a Pap test in combination with an HPV test.  Bone density scan. This is done to screen for osteoporosis. You may have this scan if you are at high risk for osteoporosis.  Discuss your test results, treatment options, and if necessary, the need for more tests with your health  care provider. Vaccines Your health care provider may recommend certain vaccines, such as:  Influenza vaccine. This is recommended every year.  Tetanus, diphtheria, and acellular pertussis (Tdap,  Td) vaccine. You may need a Td booster every 10 years.  Varicella vaccine. You may need this if you have not been vaccinated.  Zoster vaccine. You may need this after age 14.  Measles, mumps, and rubella (MMR) vaccine. You may need at least one dose of MMR if you were born in 1957 or later. You may also need a second dose.  Pneumococcal 13-valent conjugate (PCV13) vaccine. You may need this if you have certain conditions and were not previously vaccinated.  Pneumococcal polysaccharide (PPSV23) vaccine. You may need one or two doses if you smoke cigarettes or if you have certain conditions.  Meningococcal vaccine. You may need this if you have certain conditions.  Hepatitis A vaccine. You may need this if you have certain conditions or if you travel or work in places where you may be exposed to hepatitis A.  Hepatitis B vaccine. You may need this if you have certain conditions or if you travel or work in places where you may be exposed to hepatitis B.  Haemophilus influenzae type b (Hib) vaccine. You may need this if you have certain conditions.  Talk to your health care provider about which screenings and vaccines you need and how often you need them. This information is not intended to replace advice given to you by your health care provider. Make sure you discuss any questions you have with your health care provider. Document Released: 02/17/2015 Document Revised: 10/11/2015 Document Reviewed: 11/22/2014 Elsevier Interactive Patient Education  [employer].

## 2017-05-15 NOTE — Progress Notes (Signed)
Subjective:  Patient ID: Sharon Carey, female    DOB: 05-28-1975  Age: 43 y.o. MRN: [id number]  CC: Establish Care   HPI Sharon Carey presents for establishment of care and a routine physical exam.  She has a mole above her right knee she wants me to take a look at.  She also has a history of scoliosis with herniated thoracic disks.  He deals with periodic debilitating muscle spasms that she treats with stretching exercises.  She has Flexeril but cannot tolerated sedation.  She has a history of ADHD that is not treated medically at this time.  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  She is married and has 45 64 year old twin sons.  She has an 37-year-old son as well.  She is her husband have started an exercise routine at home.  She has a history of reactive airway disease and allergy rhinitis that are well treated with occasional albuterol use and Flonase.  They are both hoping to lose some weight.  Her husband snores loudly and she finds it difficult to sleep well at night on account of this issue.  Her parents are in their late 35s.  Dad is in excellent health but mom had an MI at age 51.  She had been smoking at that time.  She is now doing quite well.  Patient is 4 hours fasting today.  Her past lipid profiles that showed a mildly elevated LDL with an elevated HDL.  Blood sugars have always been normal.  History Tom has no past medical history on file.   She has no past surgical history on file.   Her family history is not on file.She reports that she has never smoked. She has never used smokeless tobacco. Her alcohol and drug histories are not on file.  Outpatient Medications Prior to Visit  Medication Sig Dispense Refill  . bimatoprost (LATISSE) 0.03 % ophthalmic solution Place 1 drop into both eyes at bedtime.    Marland Kitchen levonorgestrel (MIRENA) 20 MCG/24HR IUD 1 each by Intrauterine route once.    Marland Kitchen XIIDRA 5 % SOLN Apply 1 drop to eye daily.  6  . albuterol (PROVENTIL  HFA;VENTOLIN HFA) 108 (90 Base) MCG/ACT inhaler Inhale 2 puffs into the lungs every 6 (six) hours as needed for wheezing or shortness of breath. 1 Inhaler 0  . azithromycin (ZITHROMAX Z-PAK) 250 MG tablet 2 pills today then 1 pill a day for 4 days 6 each 0  . fluticasone-salmeterol (ADVAIR HFA) 115-21 MCG/ACT inhaler Inhale 2 puffs into the lungs 2 (two) times daily. 1 Inhaler 12  . methylPREDNISolone (MEDROL DOSEPAK) 4 MG TBPK tablet Take 6 pills on day one then decrease by 1 pill each day (Patient not taking: Reported on 10/03/2015) 21 tablet 0  . predniSONE (STERAPRED UNI-PAK 48 TAB) 10 MG (48) TBPK tablet Take by mouth daily. Use as directed 48 tablet 0  . ipratropium-albuterol (DUONEB) 0.5-2.5 (3) MG/3ML nebulizer solution 3 mL      No facility-administered medications prior to visit.     ROS Review of Systems  Constitutional: Negative for chills, fever and unexpected weight change.  HENT: Positive for postnasal drip, rhinorrhea and sinus pain.   Eyes: Negative for photophobia and visual disturbance.  Respiratory: Negative.   Cardiovascular: Negative.   Gastrointestinal: Negative.   Endocrine: Negative for polyphagia and polyuria.  Genitourinary: Negative.   Musculoskeletal: Positive for back pain and myalgias.  Skin: Positive for color change. Negative for rash and  wound.  Allergic/Immunologic: Negative for immunocompromised state.  Neurological: Negative for weakness and headaches.  Hematological: Does not bruise/bleed easily.  Psychiatric/Behavioral: Negative.     Objective:  BP 118/80 (BP Location: Left Arm, Patient Position: Sitting, Cuff Size: Normal)   Pulse 90   Ht 5' 3.39" (1.61 m)   Wt 187 lb 8 oz (85 kg)   SpO2 98%   BMI 32.81 kg/m   Physical Exam  Constitutional: She is oriented to person, place, and time. She appears well-developed and well-nourished. No distress.  HENT:  Head: Normocephalic and atraumatic.  Right Ear: External ear normal.  Left Ear:  External ear normal.  Nose: Nose normal.  Mouth/Throat: Oropharynx is clear and moist. No oropharyngeal exudate.  Eyes: Pupils are equal, round, and reactive to light. Conjunctivae and EOM are normal. Right eye exhibits no discharge. Left eye exhibits no discharge. No scleral icterus.  Neck: Normal range of motion. No JVD present. No tracheal deviation present. No thyromegaly present.  Cardiovascular: Normal rate, regular rhythm and normal heart sounds.  Pulmonary/Chest: Effort normal and breath sounds normal. No stridor. No respiratory distress. She has no wheezes.  Abdominal: Bowel sounds are normal.  Lymphadenopathy:    She has no cervical adenopathy.  Neurological: She is alert and oriented to person, place, and time.  Skin: Skin is warm and dry. Capillary refill takes less than 2 seconds. No rash noted. She is not diaphoretic. No erythema. No pallor.  Lateral distal anterior thigh is a 5 mm lesion with color variegation, asymmetry, faint inflammatory border and a waxy stuck on appearance.  Psychiatric: She has a normal mood and affect. Her behavior is normal.      Assessment & Plan:   Sharon Carey was seen today for establish care.  Diagnoses and all orders for this visit:  Thoracic back pain, unspecified back pain laterality, unspecified chronicity -     methocarbamol (ROBAXIN) 500 MG tablet; Take 1 tablet (500 mg total) by mouth every 8 (eight) hours as needed for muscle spasms.  Atypical nevi -     Ambulatory referral to Dermatology  Encounter for health maintenance examination with abnormal findings -     CBC -     Comprehensive metabolic panel -     HIV antibody -     Lipid panel -     TSH -     Urinalysis, Routine w reflex microscopic  Mild intermittent reactive airway disease without complication -     albuterol (PROVENTIL HFA;VENTOLIN HFA) 108 (90 Base) MCG/ACT inhaler; Inhale 2 puffs into the lungs every 6 (six) hours as needed for wheezing or shortness of  breath.  Seasonal allergic rhinitis due to pollen -     fluticasone (FLONASE) 50 MCG/ACT nasal spray; Place 2 sprays into both nostrils daily.   I have discontinued Sharon Carey's methylPREDNISolone, azithromycin, fluticasone-salmeterol, and predniSONE. I am also having her start on methocarbamol and fluticasone. Additionally, I am having her maintain her levonorgestrel, XIIDRA, bimatoprost, and albuterol. We will stop administering ipratropium-albuterol.  Meds ordered this encounter  Medications  . methocarbamol (ROBAXIN) 500 MG tablet    Sig: Take 1 tablet (500 mg total) by mouth every 8 (eight) hours as needed for muscle spasms.    Dispense:  60 tablet    Refill:  0  . albuterol (PROVENTIL HFA;VENTOLIN HFA) 108 (90 Base) MCG/ACT inhaler    Sig: Inhale 2 puffs into the lungs every 6 (six) hours as needed for wheezing or shortness of breath.  Dispense:  1 Inhaler    Refill:  3  . fluticasone (FLONASE) 50 MCG/ACT nasal spray    Sig: Place 2 sprays into both nostrils daily.    Dispense:  16 g    Refill:  6   Patient information was given concerning: Health maintenance and preventive care.  She was given information regarding atypical moles and back pain.  Encouraged her to bring her husband in for an apnea evaluation and referral for sleep study.  She will try her best to do this.   Follow-up: Return in about 3 months (around 08/14/2017).  Libby Maw, MD

## 2017-05-16 ENCOUNTER — Encounter: Payer: Self-pay | Admitting: Family Medicine

## 2017-05-16 LAB — URINALYSIS, ROUTINE W REFLEX MICROSCOPIC
Bilirubin Urine: NEGATIVE
Ketones, ur: NEGATIVE
LEUKOCYTES UA: NEGATIVE
NITRITE: NEGATIVE
Specific Gravity, Urine: <=1.005 — AB (ref 1.000–1.030)
Total Protein, Urine: NEGATIVE
Urine Glucose: NEGATIVE
Urobilinogen, UA: 0.2 (ref 0.0–1.0)
WBC UA: NONE SEEN (ref 0–?)
pH: 6 (ref 5.0–8.0)

## 2017-05-16 LAB — CBC
HEMATOCRIT: 39.8 % (ref 36.0–46.0)
Hemoglobin: 13.8 g/dL (ref 12.0–15.0)
MCHC: 34.6 g/dL (ref 30.0–36.0)
MCV: 91.8 fl (ref 78.0–100.0)
PLATELETS: 174 10*3/uL (ref 150.0–400.0)
RBC: 4.34 Mil/uL (ref 3.87–5.11)
RDW: 13.5 % (ref 11.5–15.5)
WBC: 4 10*3/uL (ref 4.0–10.5)

## 2017-05-16 LAB — COMPREHENSIVE METABOLIC PANEL
ALT: 17 U/L (ref 0–35)
AST: 20 U/L (ref 0–37)
Albumin: 4.4 g/dL (ref 3.5–5.2)
Alkaline Phosphatase: 70 U/L (ref 39–117)
BUN: 14 mg/dL (ref 6–23)
CALCIUM: 9.2 mg/dL (ref 8.4–10.5)
CHLORIDE: 104 meq/L (ref 96–112)
CO2: 25 meq/L (ref 19–32)
CREATININE: 0.85 mg/dL (ref 0.40–1.20)
GFR: 78.6 mL/min (ref >60.00)
Glucose, Bld: 86 mg/dL (ref 70–99)
POTASSIUM: 3.9 meq/L (ref 3.5–5.1)
Sodium: 137 mEq/L (ref 135–145)
Total Bilirubin: 0.4 mg/dL (ref 0.2–1.2)
Total Protein: 7.1 g/dL (ref 6.0–8.3)

## 2017-05-16 LAB — LIPID PANEL
CHOLESTEROL: 217 mg/dL — AB (ref 0–200)
HDL: 54.4 mg/dL (ref >39.00)
LDL CALC: 144 mg/dL — AB (ref 0–99)
NonHDL: 162.57
TRIGLYCERIDES: 93 mg/dL (ref 0.0–149.0)
Total CHOL/HDL Ratio: 4
VLDL: 18.6 mg/dL (ref 0.0–40.0)

## 2017-05-16 LAB — HIV ANTIBODY (ROUTINE TESTING W REFLEX): HIV: NONREACTIVE

## 2017-05-16 LAB — TSH: TSH: 2.29 u[IU]/mL (ref 0.35–4.50)

## 2017-06-24 ENCOUNTER — Encounter: Payer: Self-pay | Admitting: Adult Health

## 2017-06-24 ENCOUNTER — Ambulatory Visit (INDEPENDENT_AMBULATORY_CARE_PROVIDER_SITE_OTHER): Payer: Self-pay | Admitting: Adult Health

## 2017-06-24 VITALS — BP 124/90 | HR 77 | Temp 98.5°F | Wt 182.0 lb

## 2017-06-24 DIAGNOSIS — H60501 Unspecified acute noninfective otitis externa, right ear: Secondary | ICD-10-CM

## 2017-06-24 DIAGNOSIS — H6690 Otitis media, unspecified, unspecified ear: Secondary | ICD-10-CM

## 2017-06-24 DIAGNOSIS — H6981 Other specified disorders of Eustachian tube, right ear: Secondary | ICD-10-CM

## 2017-06-24 MED ORDER — NEOMYCIN-POLYMYXIN-HC 3.5-10000-1 OT SOLN: 3.0000 [drp] | Freq: Three times a day (TID) | OTIC | 0 refills | Status: DC

## 2017-06-24 MED ORDER — PREDNISONE 10 MG (21) PO TBPK: ORAL_TABLET | ORAL | 0 refills | Status: DC

## 2017-06-24 MED ORDER — CEFDINIR 300 MG PO CAPS: 300.0000 mg | ORAL_CAPSULE | Freq: Two times a day (BID) | ORAL | 0 refills | Status: DC

## 2017-06-24 NOTE — Progress Notes (Addendum)
Subjective:     Patient ID: Sharon Carey, female   DOB: 05-01-1975, 42 y.o.   MRN: [id number]  HPI   Blood pressure 124/90, pulse 77, temperature 98.5 F (36.9 C), weight 182 lb (82.6 kg), SpO2 98 %.  Patient is a a 42 year old female in no acute distress who comes to the clinic with right ear pain that started since this past weekend. She felt better Sunday. [place]  She has some high frequency hearing loss for years with " normal mild "  tinnitus.  She has had some tinnitus on and off like normal during her mountain trip however has had lasting most of the time in right ear since yesterday afternoon.  Denies any ASA use  No LMP recorded. (Menstrual status: IUD). Denies any chance of pregnancy.  She denies headache.  Patient  denies any fever, body aches,chills, rash, chest pain, shortness of breath, nausea, vomiting, or diarrhea.   Denies swimming though she was in the hot tub but tried not to get her hair and ears wet.   She is a Software engineer.   Patient Active Problem List   Diagnosis Date Noted  . Encounter for health maintenance examination with abnormal findings 05/15/2017  . Thoracic back pain 05/15/2017  . Atypical nevi 05/15/2017  . Reactive airway disease 05/15/2017  . Seasonal allergic rhinitis due to pollen 05/15/2017     Review of Systems  Constitutional: Negative.   HENT: Positive for ear pain, postnasal drip, rhinorrhea and tinnitus (right ear - has history of high frequency hearing loss for years- ). Negative for ear discharge.   Eyes: Negative.   Respiratory: Negative.   Cardiovascular: Negative.   Gastrointestinal: Negative.   Musculoskeletal: Negative.   Allergic/Immunologic:       Allergy to Amoxicillin reports she can take cephalosporins without any reaction. Has taken before.  She is a Pharm D   Neurological: Negative.   Hematological: Negative.   Psychiatric/Behavioral: Negative.        Objective:    Physical Exam  Constitutional: She is oriented to person, place, and time. Vital signs are normal. She appears well-developed and well-nourished. She is active.  Non-toxic appearance. She does not have a sickly appearance. She does not appear ill. No distress.  Patient moves on and off of exam table and in room without difficulty. Gait is normal in hall and in room. Patient is oriented to person place time and situation. Patient answers questions appropriately and engages in conversation. Patient is alert and oriented and responsive to questions Engages in eye contact with provider. Speaks in full sentences without any pauses without any shortness of breath.    HENT:  Head: Normocephalic and atraumatic.  Right Ear: External ear normal. There is swelling (mild swelling in ear canal able to fully visualize tympanic membrane and canal has mild erythema lower canal ). Tympanic membrane is erythematous and bulging. Tympanic membrane is not perforated and not retracted. A middle ear effusion (moderate fluid darker in color behind right tympanic membrane / dull tympanic membrane ) is present.  Left Ear: Hearing, external ear and ear canal normal. Tympanic membrane is not perforated, not erythematous and not bulging. A middle ear effusion is present.  Nose: Nose normal.  Mouth/Throat: Oropharynx is clear and moist. No oropharyngeal exudate.  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 She is able to communicate and hear provider normally in  room.  Engages in normal conversation.   Eyes: Pupils are equal, round, and reactive to light. Conjunctivae and EOM are normal. Right eye exhibits no discharge. Left eye exhibits no discharge. No scleral icterus.  Neck: Trachea normal, normal range of motion, full passive range of motion without pain and phonation normal. Neck supple. No JVD present. No tracheal deviation present.  Cardiovascular: Normal rate,  regular rhythm, normal heart sounds and intact distal pulses. Exam reveals no gallop and no friction rub.  No murmur heard. Pulmonary/Chest: Effort normal and breath sounds normal. No stridor. No respiratory distress. She has no wheezes. She has no rales. She exhibits no tenderness.  Abdominal: Soft. Bowel sounds are normal.  Musculoskeletal: Normal range of motion.  Lymphadenopathy:       Head (right side): No submental, no submandibular, no tonsillar, no preauricular, no posterior auricular and no occipital adenopathy present.       Head (left side): No submental, no submandibular, no tonsillar, no preauricular, no posterior auricular and no occipital adenopathy present.    She has no cervical adenopathy.  Neurological: She is alert and oriented to person, place, and time. She has normal strength. She displays normal reflexes. No cranial nerve deficit. She exhibits normal muscle tone. She displays a negative Romberg sign. Coordination and gait normal. GCS eye subscore is 4. GCS verbal subscore is 5. GCS motor subscore is 6.  Skin: Skin is warm and dry. Capillary refill takes less than 2 seconds. No rash noted. She is not diaphoretic. No erythema. No pallor.  Psychiatric: She has a normal mood and affect. Her speech is normal and behavior is normal. Judgment and thought content normal. Cognition and memory are normal.  Vitals reviewed.      Assessment:     Eustachian tube dysfunction, right  Acute otitis media, unspecified otitis media type  Acute otitis externa of right ear, unspecified type      Plan:     Meds ordered this encounter  Medications  . cefdinir (OMNICEF) 300 MG capsule    Sig: Take 1 capsule (300 mg total) by mouth 2 (two) times daily.    Dispense:  20 capsule    Refill:  0  . predniSONE (STERAPRED UNI-PAK 21 TAB) 10 MG (21) TBPK tablet    Sig: By mouth Take 6 tablets on day 1, Take 5 tablets day 2 Take 4 tablets day 3 Take 3 tablets day 4 Take 2 tablets day five 5  Take 1 tablet day    Dispense:  21 tablet    Refill:  0  . neomycin-polymyxin-hydrocortisone (CORTISPORIN) OTIC solution    Sig: Place 3 drops into the right ear 3 (three) times daily.    Dispense:  10 mL    Refill:  0      Advised patient call the office or your primary care doctor for an appointment if no improvement within 72 hours or if any symptoms change or worsen at any time  Advised ER or urgent Care if after hours or on weekend. Call 911 for emergency symptoms at any time.Patinet verbalized understanding of all instructions given/reviewed and treatment plan and has no further questions or concerns at this time.    Patient verbalized understanding of all instructions given and denies any further questions at this time.

## 2017-06-24 NOTE — Patient Instructions (Signed)
Tinnitus Tinnitus refers to hearing a sound when there is no actual source for that sound. This is often described as ringing in the ears. However, people with this condition may hear a variety of noises. A person may hear the sound in one ear or in both ears. The sounds of tinnitus can be soft, loud, or somewhere in between. Tinnitus can last for a few seconds or can be constant for days. It may go away without treatment and come back at various times. When tinnitus is constant or happens often, it can lead to other problems, such as trouble sleeping and trouble concentrating. Almost everyone experiences tinnitus at some point. Tinnitus that is long-lasting (chronic) or comes back often is a problem that may require medical attention. What are the causes? The cause of tinnitus is often not known. In some cases, it can result from other problems or conditions, including:  Exposure to loud noises from machinery, music, or other sources.  Hearing loss.  Ear or sinus infections.  Earwax buildup.  A foreign object in the ear.  Use of certain medicines.  Use of alcohol and caffeine.  High blood pressure.  Heart diseases.  Anemia.  Allergies.  Meniere disease.  Thyroid problems.  Tumors.  An enlarged part of a weakened blood vessel (aneurysm).  What are the signs or symptoms? The main symptom of tinnitus is hearing a sound when there is no source for that sound. It may sound like:  Buzzing.  Roaring.  Ringing.  Blowing air, similar to the sound heard when you listen to a seashell.  Hissing.  Whistling.  Sizzling.  Humming.  Running water.  A sustained musical note.  How is this diagnosed? Tinnitus is diagnosed based on your symptoms. Your health care provider will do a physical exam. A comprehensive hearing exam (audiologic exam) will be done if your tinnitus:  Affects only one ear (unilateral).  Causes hearing difficulties.  Lasts 6 months or  longer.  You may also need to see a health care provider who specializes in hearing disorders (audiologist). You may be asked to complete a questionnaire to determine the severity of your tinnitus. Tests may be done to help determine the cause and to rule out other conditions. These can include:  Imaging studies of your head and brain, such as: ? A CT scan. ? An MRI.  An imaging study of your blood vessels (angiogram).  How is this treated? Treating an underlying medical condition can sometimes make tinnitus go away. If your tinnitus continues, other treatments may include:  Medicines, such as certain antidepressants or sleeping aids.  Sound generators to mask the tinnitus. These include: ? Tabletop sound machines that play relaxing sounds to help you fall asleep. ? Wearable devices that fit in your ear and play sounds or music. ? A small device that uses headphones to deliver a signal embedded in music (acoustic neural stimulation). In time, this may change the pathways of your brain and make you less sensitive to tinnitus. This device is used for very severe cases when no other treatment is working.  Therapy and counseling to help you manage the stress of living with tinnitus.  Using hearing aids or cochlear implants, if your tinnitus is related to hearing loss.  Follow these instructions at home:  When possible, avoid being in loud places and being exposed to loud sounds.  Wear hearing protection, such as earplugs, when you are exposed to loud noises.  Do not take stimulants, such as nicotine,   alcohol, or caffeine.  Practice techniques for reducing stress, such as meditation, yoga, or deep breathing.  Use a white noise machine, a humidifier, or other devices to mask the sound of tinnitus.  Sleep with your head slightly raised. This may reduce the impact of tinnitus.  Try to get plenty of rest each night. Contact a health care provider if:  You have tinnitus in just one  ear.  Your tinnitus continues for 3 weeks or longer without stopping.  Home care measures are not helping.  You have tinnitus after a head injury.  You have tinnitus along with any of the following: ? Dizziness. ? Loss of balance. ? Nausea and vomiting. This information is not intended to replace advice given to you by your health care provider. Make sure you discuss any questions you have with your health care provider. Document Released: 01/21/2005 Document Revised: 09/24/2015 Document Reviewed: 06/23/2013 Elsevier Interactive Patient Education  2018 [employer]. Otitis Externa Otitis externa is an infection of the outer ear canal. The outer ear canal is the area between the outside of the ear and the eardrum. Otitis externa is sometimes called "swimmer's ear." Follow these instructions at home:  If you were given antibiotic ear drops, use them as told by your doctor. Do not stop using them even if your condition gets better.  Take over-the-counter and prescription medicines only as told by your doctor.  Keep all follow-up visits as told by your doctor. This is important. How is this prevented?  Keep your ear dry. Use the corner of a towel to dry your ear after you swim or bathe.  Try not to scratch or put things in your ear. Doing these things makes it easier for germs to grow in your ear.  Avoid swimming in lakes, dirty water, or pools that may not have the right amount of a chemical called chlorine.  Consider making ear drops and putting 3 or 4 drops in each ear after you swim. Ask your doctor about how you can make ear drops. Contact a doctor if:  You have a fever.  After 3 days your ear is still red, swollen, or painful.  After 3 days you still have pus coming from your ear.  Your redness, swelling, or pain gets worse.  You have a really bad headache.  You have redness, swelling, pain, or tenderness behind your ear. This information is not intended to replace  advice given to you by your health care provider. Make sure you discuss any questions you have with your health care provider. Document Released: 07/10/2007 Document Revised: 02/16/2015 Document Reviewed: 10/31/2014 Elsevier Interactive Patient Education  2018 [employer]. Otitis Media, Adult Otitis media is redness, soreness, and puffiness (swelling) in the space just behind your eardrum (middle ear). It may be caused by allergies or infection. It often happens along with a cold. Follow these instructions at home:  Take your medicine as told. Finish it even if you start to feel better.  Only take over-the-counter or prescription medicines for pain, discomfort, or fever as told by your doctor.  Follow up with your doctor as told. Contact a doctor if:  You have otitis media only in one ear, or bleeding from your nose, or both.  You notice a lump on your neck.  You are not getting better in 3-5 days.  You feel worse instead of better. Get help right away if:  You have pain that is not helped with medicine.  You have puffiness, redness,  or pain around your ear.  You get a stiff neck.  You cannot move part of your face (paralysis).  You notice that the bone behind your ear hurts when you touch it. This information is not intended to replace advice given to you by your health care provider. Make sure you discuss any questions you have with your health care provider. Document Released: 07/10/2007 Document Revised: 06/29/2015 Document Reviewed: 08/18/2012 Elsevier Interactive Patient Education  2017 [employer].

## 2017-06-26 ENCOUNTER — Telehealth: Payer: Self-pay | Admitting: Emergency Medicine

## 2017-06-26 NOTE — Telephone Encounter (Signed)
Left message follow up call with visit from [facility] 

## 2017-07-14 DIAGNOSIS — I8393 Asymptomatic varicose veins of bilateral lower extremities: Secondary | ICD-10-CM | POA: Diagnosis not present

## 2017-07-14 DIAGNOSIS — D2371 Other benign neoplasm of skin of right lower limb, including hip: Secondary | ICD-10-CM | POA: Diagnosis not present

## 2017-07-14 DIAGNOSIS — Z411 Encounter for cosmetic surgery: Secondary | ICD-10-CM | POA: Diagnosis not present

## 2017-07-14 DIAGNOSIS — I781 Nevus, non-neoplastic: Secondary | ICD-10-CM | POA: Diagnosis not present

## 2017-07-14 DIAGNOSIS — D239 Other benign neoplasm of skin, unspecified: Secondary | ICD-10-CM | POA: Diagnosis not present

## 2017-08-14 ENCOUNTER — Encounter: Payer: Self-pay | Admitting: Family Medicine

## 2017-08-14 ENCOUNTER — Ambulatory Visit: Payer: 59 | Admitting: Family Medicine

## 2017-08-14 VITALS — BP 128/70 | HR 85 | Ht 63.39 in | Wt 173.5 lb

## 2017-08-14 DIAGNOSIS — E7800 Pure hypercholesterolemia, unspecified: Secondary | ICD-10-CM | POA: Insufficient documentation

## 2017-08-14 NOTE — Progress Notes (Signed)
Subjective:  Patient ID: Sharon Carey, female    DOB: 04-15-1975  Age: 42 y.o. MRN: [id number]  CC: Follow-up   HPI Sharon Carey presents for follow-up of her elevated LDL cholesterol.  She has been dieting and exercising and has been able to lose weight.  Patient does not smoke and leads a much healthier lifestyle than her mother who had smoked and had suffered a MI at age 26.  She did have the mole removed over her right knee and it was benign.  Her husband is scheduled and will attend his sleep study.  Outpatient Medications Prior to Visit  Medication Sig Dispense Refill  . albuterol (PROVENTIL HFA;VENTOLIN HFA) 108 (90 Base) MCG/ACT inhaler Inhale 2 puffs into the lungs every 6 (six) hours as needed for wheezing or shortness of breath. 1 Inhaler 3  . bimatoprost (LATISSE) 0.03 % ophthalmic solution Place 1 drop into both eyes at bedtime.    . fluticasone (FLONASE) 50 MCG/ACT nasal spray Place 2 sprays into both nostrils daily. 16 g 6  . levonorgestrel (MIRENA) 20 MCG/24HR IUD 1 each by Intrauterine route once.    . methocarbamol (ROBAXIN) 500 MG tablet Take 1 tablet (500 mg total) by mouth every 8 (eight) hours as needed for muscle spasms. 60 tablet 0  . XIIDRA 5 % SOLN Apply 1 drop to eye daily.  6  . cefdinir (OMNICEF) 300 MG capsule Take 1 capsule (300 mg total) by mouth 2 (two) times daily. 20 capsule 0  . neomycin-polymyxin-hydrocortisone (CORTISPORIN) OTIC solution Place 3 drops into the right ear 3 (three) times daily. 10 mL 0  . predniSONE (STERAPRED UNI-PAK 21 TAB) 10 MG (21) TBPK tablet By mouth Take 6 tablets on day 1, Take 5 tablets day 2 Take 4 tablets day 3 Take 3 tablets day 4 Take 2 tablets day five 5 Take 1 tablet day 21 tablet 0   No facility-administered medications prior to visit.     ROS Review of Systems  Constitutional: Negative.   HENT: Negative.   Respiratory: Negative.   Cardiovascular: Negative.   Gastrointestinal: Negative.     Psychiatric/Behavioral: Negative.     Objective:  BP 128/70   Pulse 85   Ht 5' 3.39" (1.61 m)   Wt 173 lb 8 oz (78.7 kg)   SpO2 95%   BMI 30.36 kg/m   BP Readings from Last 3 Encounters:  08/14/17 128/70  06/24/17 124/90  05/15/17 118/80    Wt Readings from Last 3 Encounters:  08/14/17 173 lb 8 oz (78.7 kg)  06/24/17 182 lb (82.6 kg)  05/15/17 187 lb 8 oz (85 kg)    Physical Exam  Constitutional: She is oriented to person, place, and time. She appears well-developed and well-nourished. No distress.  HENT:  Head: Normocephalic and atraumatic.  Right Ear: External ear normal.  Eyes: Right eye exhibits no discharge. Left eye exhibits no discharge. No scleral icterus.  Pulmonary/Chest: Effort normal.  Neurological: She is alert and oriented to person, place, and time.  Skin: She is not diaphoretic.     Psychiatric: She has a normal mood and affect. Her behavior is normal. Thought content normal.    Lab Results  Component Value Date   WBC 4.0 05/15/2017   HGB 13.8 05/15/2017   HCT 39.8 05/15/2017   PLT 174.0 05/15/2017   GLUCOSE 86 05/15/2017   CHOL 217 (H) 05/15/2017   TRIG 93.0 05/15/2017   HDL 54.40 05/15/2017   LDLCALC 144 (H) 05/15/2017  ALT 17 05/15/2017   AST 20 05/15/2017   NA 137 05/15/2017   K 3.9 05/15/2017   CL 104 05/15/2017   CREATININE 0.85 05/15/2017   BUN 14 05/15/2017   CO2 25 05/15/2017   TSH 2.29 05/15/2017    [place] Axilla Right  Result Date: 01/03/2017 CLINICAL DATA:  Status post benign biopsy of a right axillary lymph node on 06/20/2016. Six-month follow-up was recommended at that time to ensure stability of the sonographic appearance. EXAM: ULTRASOUND OF THE RIGHT AXILLA COMPARISON:  Previous exam(s). FINDINGS: Targeted ultrasound is performed, showing an interval decrease in size of all 4 of the previously demonstrated lymph nodes in the right axilla, including the biopsied lymph node. No new enlarged or morphologically abnormal lymph  nodes are identified. IMPRESSION: Interval decrease in size of the previously demonstrated lymph nodes in the right axilla, confirming benignity for all. No further follow-up diagnostic imaging is necessary for this benign finding. RECOMMENDATION: Screening mammogram at age 75 unless there are persistent or intervening clinical concerns. (Code:SM-B-40A). Patient will be due for bilateral screening mammogram in May of 2019. I have discussed the findings and recommendations with the patient. Results were also provided in writing at the conclusion of the visit. If applicable, a reminder letter will be sent to the patient regarding the next appointment. BI-RADS CATEGORY  2: Benign. Electronically Signed   By: Franki Cabot M.D.   On: 01/03/2017 15:49    Assessment & Plan:   Sharon Carey was seen today for follow-up.  Diagnoses and all orders for this visit:  Elevated LDL cholesterol level -     Cancel: Lipid panel -     Lipid panel; Future   I have discontinued Sharon Carey's cefdinir, predniSONE, and neomycin-polymyxin-hydrocortisone. I am also having her maintain her levonorgestrel, XIIDRA, bimatoprost, methocarbamol, albuterol, and fluticasone.  No orders of the defined types were placed in this encounter.  Patient will continue her lifestyle modifications and return at her convenience for repeat fasting lipid profile.  Follow-up: Return will return for fasting lipid profile.  Libby Maw, MD

## 2017-08-14 NOTE — Patient Instructions (Signed)
Fat and Cholesterol Restricted Diet High levels of fat and cholesterol in your blood may lead to various health problems, such as diseases of the heart, blood vessels, gallbladder, liver, and pancreas. Fats are concentrated sources of energy that come in various forms. Certain types of fat, including saturated fat, may be harmful in excess. Cholesterol is a substance needed by your body in small amounts. Your body makes all the cholesterol it needs. Excess cholesterol comes from the food you eat. When you have high levels of cholesterol and saturated fat in your blood, health problems can develop because the excess fat and cholesterol will gather along the walls of your blood vessels, causing them to narrow. Choosing the right foods will help you control your intake of fat and cholesterol. This will help keep the levels of these substances in your blood within normal limits and reduce your risk of disease. What is my plan? Your health care provider recommends that you:  Limit your fat intake to ______% or less of your total calories per day.  Limit the amount of cholesterol in your diet to less than _________mg per day.  Eat 20-30 grams of fiber each day.  What types of fat should I choose?  Choose healthy fats more often. Choose monounsaturated and polyunsaturated fats, such as olive and canola oil, flaxseeds, walnuts, almonds, and seeds.  Eat more omega-3 fats. Good choices include salmon, mackerel, sardines, tuna, flaxseed oil, and ground flaxseeds. Aim to eat fish at least two times a week.  Limit saturated fats. Saturated fats are primarily found in animal products, such as meats, butter, and cream. Plant sources of saturated fats include palm oil, palm kernel oil, and coconut oil.  Avoid foods with partially hydrogenated oils in them. These contain trans fats. Examples of foods that contain trans fats are stick margarine, some tub margarines, cookies, crackers, and other baked goods. What  general guidelines do I need to follow? These guidelines for healthy eating will help you control your intake of fat and cholesterol:  Check food labels carefully to identify foods with trans fats or high amounts of saturated fat.  Fill one half of your plate with vegetables and green salads.  Fill one fourth of your plate with whole grains. Look for the word "whole" as the first word in the ingredient list.  Fill one fourth of your plate with lean protein foods.  Limit fruit to two servings a day. Choose fruit instead of juice.  Eat more foods that contain fiber, such as apples, broccoli, carrots, beans, peas, and barley.  Eat more home-cooked food and less restaurant, buffet, and fast food.  Limit or avoid alcohol.  Limit foods high in starch and sugar.  Limit fried foods.  Cook foods using methods other than frying. Baking, boiling, grilling, and broiling are all great options.  Lose weight if you are overweight. Losing just 5-10% of your initial body weight can help your overall health and prevent diseases such as diabetes and heart disease.  What foods can I eat? Grains  Whole grains, such as whole wheat or whole grain breads, crackers, cereals, and pasta. Unsweetened oatmeal, bulgur, barley, quinoa, or brown rice. Corn or whole wheat flour tortillas. Vegetables  Fresh or frozen vegetables (raw, steamed, roasted, or grilled). Green salads. Fruits  All fresh, canned (in natural juice), or frozen fruits. Meats and other protein foods  Ground beef (85% or leaner), grass-fed beef, or beef trimmed of fat. Skinless chicken or turkey. Ground chicken or turkey.   Pork trimmed of fat. All fish and seafood. Eggs. Dried beans, peas, or lentils. Unsalted nuts or seeds. Unsalted canned or dry beans. Dairy  Low-fat dairy products, such as skim or 1% milk, 2% or reduced-fat cheeses, low-fat ricotta or cottage cheese, or plain low-fat yo Fats and oils  Tub margarines without trans  fats. Light or reduced-fat mayonnaise and salad dressings. Avocado. Olive, canola, sesame, or safflower oils. Natural peanut or almond butter (choose ones without added sugar and oil). The items listed above may not be a complete list of recommended foods or beverages. Contact your dietitian for more options. Foods to avoid Grains  White bread. White pasta. White rice. Cornbread. Bagels, pastries, and croissants. Crackers that contain trans fat. Vegetables  White potatoes. Corn. Creamed or fried vegetables. Vegetables in a cheese sauce. Fruits  Dried fruits. Canned fruit in light or heavy syrup. Fruit juice. Meats and other protein foods  Fatty cuts of meat. Ribs, chicken wings, bacon, sausage, bologna, salami, chitterlings, fatback, hot dogs, bratwurst, and packaged luncheon meats. Liver and organ meats. Dairy  Whole or 2% milk, cream, half-and-half, and cream cheese. Whole milk cheeses. Whole-fat or sweetened yogurt. Full-fat cheeses. Nondairy creamers and whipped toppings. Processed cheese, cheese spreads, or cheese curds. Beverages  Alcohol. Sweetened drinks (such as sodas, lemonade, and fruit drinks or punches). Fats and oils  Butter, stick margarine, lard, shortening, ghee, or bacon fat. Coconut, palm kernel, or palm oils. Sweets and desserts  Corn syrup, sugars, honey, and molasses. Candy. Jam and jelly. Syrup. Sweetened cereals. Cookies, pies, cakes, donuts, muffins, and ice cream. The items listed above may not be a complete list of foods and beverages to avoid. Contact your dietitian for more information. This information is not intended to replace advice given to you by your health care provider. Make sure you discuss any questions you have with your health care provider. Document Released: 01/21/2005 Document Revised: 02/11/2014 Document Reviewed: 04/21/2013 Elsevier Interactive Patient Education  2018 [employer]  

## 2017-08-28 ENCOUNTER — Other Ambulatory Visit: Payer: Self-pay | Admitting: Family Medicine

## 2017-08-28 DIAGNOSIS — M546 Pain in thoracic spine: Secondary | ICD-10-CM

## 2017-08-28 MED ORDER — METHOCARBAMOL 500 MG PO TABS: 500.0000 mg | ORAL_TABLET | Freq: Three times a day (TID) | ORAL | 0 refills | Status: DC | PRN

## 2017-12-08 ENCOUNTER — Ambulatory Visit (INDEPENDENT_AMBULATORY_CARE_PROVIDER_SITE_OTHER): Payer: Self-pay | Admitting: Physician Assistant

## 2017-12-08 ENCOUNTER — Encounter: Payer: Self-pay | Admitting: Physician Assistant

## 2017-12-08 VITALS — BP 130/80 | HR 70 | Temp 98.2°F | Wt 174.0 lb

## 2017-12-08 DIAGNOSIS — H6504 Acute serous otitis media, recurrent, right ear: Secondary | ICD-10-CM

## 2017-12-08 MED ORDER — PREDNISONE 10 MG (21) PO TBPK: ORAL_TABLET | ORAL | 0 refills | Status: DC

## 2017-12-08 MED ORDER — MONTELUKAST SODIUM 10 MG PO TABS: 10.0000 mg | ORAL_TABLET | Freq: Every day | ORAL | 0 refills | Status: DC

## 2017-12-08 NOTE — Patient Instructions (Signed)
Thank you for choosing [facility] for your health care needs.  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  Continue Flonase nasal spray. Continue antihistamine such as Claritin, Allegra, or Zyrtec.  May use an over the counter decongestant such as Sudafed, Dayquil, or Mucinex-D.  You have been prescribed a Prednisone taper. You have also been prescribed Singulair, a leukotriene inhibitor - can help with seasonal allergies.  Recommend you follow-up with ENT for further evaluation and treatment.  Eustachian Tube Dysfunction The eustachian tube connects the middle ear to the back of the nose. It regulates air pressure in the middle ear by allowing air to move between the ear and nose. It also helps to drain fluid from the middle ear space. When the eustachian tube does not function properly, air pressure, fluid, or both can build up in the middle ear. Eustachian tube dysfunction can affect one or both ears. What are the causes? This condition happens when the eustachian tube becomes blocked or cannot open normally. This may result from:  Ear infections.  Colds and other upper respiratory infections.  Allergies.  Irritation, such as from cigarette smoke or acid from the stomach coming up into the esophagus (gastroesophageal reflux).  Sudden changes in air pressure, such as from descending in an airplane.  Abnormal growths in the nose or throat, such as nasal polyps, tumors, or enlarged tissue at the back of the throat (adenoids).  What increases the risk? This condition may be more likely to develop in people who smoke and people who are overweight. Eustachian tube dysfunction may also be more likely to develop in children, especially children who have:  Certain birth defects of the mouth, such as cleft palate.  Large tonsils and adenoids.  What are the signs or symptoms? Symptoms of this condition may include:  A feeling of fullness in the ear.  Ear  pain.  Clicking or popping noises in the ear.  Ringing in the ear.  Hearing loss.  Loss of balance.  Symptoms may get worse when the air pressure around you changes, such as when you travel to an area of high elevation or fly on an airplane. How is this diagnosed? This condition may be diagnosed based on:  Your symptoms.  A physical exam of your ear, nose, and throat.  Tests, such as those that measure: ? The movement of your eardrum (tympanogram). ? Your hearing (audiometry).  How is this treated? Treatment depends on the cause and severity of your condition. If your symptoms are mild, you may be able to relieve your symptoms by moving air into ("popping") your ears. If you have symptoms of fluid in your ears, treatment may include:  Decongestants.  Antihistamines.  Nasal sprays or ear drops that contain medicines that reduce swelling (steroids).  In some cases, you may need to have a procedure to drain the fluid in your eardrum (myringotomy). In this procedure, a small tube is placed in the eardrum to:  Drain the fluid.  Restore the air in the middle ear space.  Follow these instructions at home:  Take over-the-counter and prescription medicines only as told by your health care provider.  Use techniques to help pop your ears as recommended by your health care provider. These may include: ? Chewing gum. ? Yawning. ? Frequent, forceful swallowing. ? Closing your mouth, holding your nose closed, and gently blowing as if you are trying to blow air out of your nose.  Do not do any of the following until your  health care provider approves: ? Travel to high altitudes. ? Fly in airplanes. ? Work in a Pension scheme manager or room. ? Scuba dive.  Keep your ears dry. Dry your ears completely after showering or bathing.  Do not smoke.  Keep all follow-up visits as told by your health care provider. This is important. Contact a health care provider if:  Your symptoms do  not go away after treatment.  Your symptoms come back after treatment.  You are unable to pop your ears.  You have: ? A fever. ? Pain in your ear. ? Pain in your head or neck. ? Fluid draining from your ear.  Your hearing suddenly changes.  You become very dizzy.  You lose your balance. This information is not intended to replace advice given to you by your health care provider. Make sure you discuss any questions you have with your health care provider. Document Released: 02/17/2015 Document Revised: 06/29/2015 Document Reviewed: 02/09/2014 Elsevier Interactive Patient Education  [employer].

## 2017-12-08 NOTE — Progress Notes (Signed)
Patient ID: AYANAH SNADER DOB: Aug 13, 1975 AGE: 42 y.o. MRN: [id number]   PCP: Libby Maw, MD   Chief Complaint: Left ear tinnitus   Subjective:    HPI:  Sharon Carey is a 42 y.o. female presents for evaluation left ear tinnitus.  42 year old female presents with four day history of right ear muffled/clogged sensation. Sounds like in a wind tunnel. No known precipitating cause. No ear injury/trauma. No recent elevation; flight, hiking, etc. No recent swimming. Patient no longer uses ear plugs. Patient with fall and spring seasonal allergies; uses Flonase regularly. Has not flared-up recently; has not been using her typical night time Zytec dose and daytime Mucinex dose. Patient reports very minimal rhinorrhea/nasal congestion. Denies fever, chills, headache, dizziness/lightheadedness, ear discharge/drainage, sinus pain, sore throat, cough. Denies associated ear pain.  Patient with previous history of similar symptoms in May 2019. Seen at [facility]. Diagnosed with eustachian tube dysfunction, otitis externa, and otitis media. Patient states at that time she had associated ear pain. Patient states took months for pressure/clogged sensation to resolve.  Patient several years ago underwent a physical for the [employer] (did not end up enlisting). States she was told that she had mild hearing loss. Patient also reports baseline mild tinnitus (current hearing changes are different than her normal).  Patient has not been evaluated by a ENT previously.  A complete, at least 10 system review of symptoms was performed, pertinent positives and negatives as mentioned in HPI, otherwise negative.  The following portions of the patient's history were reviewed and updated as appropriate: allergies, current medications and past medical history.  Patient Active Problem List   Diagnosis Date Noted  . Elevated LDL cholesterol level 08/14/2017  . Encounter for health maintenance examination with  abnormal findings 05/15/2017  . Thoracic back pain 05/15/2017  . Atypical nevi 05/15/2017  . Reactive airway disease 05/15/2017  . Seasonal allergic rhinitis due to pollen 05/15/2017    Allergies  Allergen Reactions  . Amoxicillin     Current Outpatient Medications on File Prior to Visit  Medication Sig Dispense Refill  . albuterol (PROVENTIL HFA;VENTOLIN HFA) 108 (90 Base) MCG/ACT inhaler Inhale 2 puffs into the lungs every 6 (six) hours as needed for wheezing or shortness of breath. 1 Inhaler 3  . bimatoprost (LATISSE) 0.03 % ophthalmic solution Place 1 drop into both eyes at bedtime.    . fluticasone (FLONASE) 50 MCG/ACT nasal spray Place 2 sprays into both nostrils daily. 16 g 6  . levonorgestrel (MIRENA) 20 MCG/24HR IUD 1 each by Intrauterine route once.    . methocarbamol (ROBAXIN) 500 MG tablet Take 1 tablet (500 mg total) by mouth every 8 (eight) hours as needed for muscle spasms. 60 tablet 0  . XIIDRA 5 % SOLN Apply 1 drop to eye daily.  6   No current facility-administered medications on file prior to visit.        Objective:   Vitals:   12/08/17 1232  BP: 130/80  Pulse: 70  Temp: 98.2 F (36.8 C)  SpO2: 99%     Wt Readings from Last 3 Encounters:  12/08/17 174 lb (78.9 kg)  08/14/17 173 lb 8 oz (78.7 kg)  06/24/17 182 lb (82.6 kg)    Physical Exam:   General Appearance:  Alert, cooperative, appears stated age. In no acute distress. Afebrile.  Head:  Normocephalic, without obvious abnormality, atraumatic  Eyes:  PERRL, conjunctiva/corneas clear, EOM's intact, fundi benign, both eyes  Ears:  Normal TM's and  external ear canals, both ears. Very minimal cerumen; no impaction. No canal edema or erythema. No purulent drainage. TMs WNL. No erythema or injection. Right TM with very minimal bulging. No visible fluid line. No visible bubbles.  Nose: Nares normal, septum midline. Scant clear rhinorrhea. Normal mucosa. No sinus tenderness with percussion/palpation.   Throat: Lips, mucosa, and tongue normal; teeth and gums normal. Throat reveals no erythema. Tonsils with no enlargement or exudate.  Neck: Supple, symmetrical, trachea midline, no adenopathy;  thyroid: not enlarged, symmetric, no tenderness/mass/nodules; no carotid bruit or JVD  Back:   Symmetric, no curvature, ROM normal, no CVA tenderness  Lungs:   Clear to auscultation bilaterally, respirations unlabored  Heart:  Regular rate and rhythm, S1 and S2 normal, no murmur, rub, or gallop  Abdomen:   Soft, non-tender, bowel sounds active all four quadrants,  no masses, no organomegaly  Extremities: Extremities normal, atraumatic, no cyanosis or edema  Pulses: 2+ and symmetric  Skin: Skin color, texture, turgor normal, no rashes or lesions  Lymph nodes: Cervical, supraclavicular, and axillary nodes normal  Neurologic: Normal    Assessment & Plan:    Exam findings, diagnosis etiology and medication use and indications reviewed with patient. Follow-Up and discharge instructions provided. No emergent/urgent issues found on exam.  Patient education was provided.   Patient verbalized understanding of information provided and agrees with plan of care (POC), all questions answered. The patient is advised to call or return to clinic if condition does not see an improvement in symptoms, or to seek the care of the closest emergency department if condition worsens with the below plan.   1. Recurrent acute serous otitis media of right ear  - predniSONE (STERAPRED UNI-PAK 21 TAB) 10 MG (21) TBPK tablet; Take 6 pills po qd x 1 day, then 5 po qd x 1 day, then 4 po qd x 1 day, then 3 po qd x 1 day, then 2 po qd x 1 day, then 1 po qd x 1 day  Dispense: 21 tablet; Refill: 0 - montelukast (SINGULAIR) 10 MG tablet; Take 1 tablet (10 mg total) by mouth at bedtime.  Dispense: 30 tablet; Refill: 0  Patient with four day history of right ear clogged/muffled hearing sensation. Possible serous effusion. Prescribed  prednisone taper and singulair. Advised antihistamine, decongestant, and steroid nasal spray. Advised patient f/u with ENT, given recurrent nature of this issue. Also, patient has never been evaluated for long-standing tinnitus.   Sharon Carey, MHS, PA-C Montey Hora, MHS, PA-C Advanced Practice Provider [facility]  [address], [facility], [place], [place] [address] (p):  [phone] [email or web address]

## 2017-12-11 ENCOUNTER — Telehealth: Payer: Self-pay | Admitting: Emergency Medicine

## 2017-12-11 NOTE — Telephone Encounter (Signed)
Follow up call from [facility]. Spoke with patient whom stated that she is doing better.

## 2018-01-08 ENCOUNTER — Other Ambulatory Visit: Payer: Self-pay | Admitting: Otolaryngology

## 2018-01-08 DIAGNOSIS — H9311 Tinnitus, right ear: Secondary | ICD-10-CM

## 2018-01-08 DIAGNOSIS — H93291 Other abnormal auditory perceptions, right ear: Secondary | ICD-10-CM | POA: Diagnosis not present

## 2018-01-29 ENCOUNTER — Ambulatory Visit
Admission: RE | Admit: 2018-01-29 | Discharge: 2018-01-29 | Disposition: A | Discharge status: Home / Self Care | Payer: 59 | Source: Ambulatory Visit | Attending: Otolaryngology | Admitting: Otolaryngology

## 2018-01-29 DIAGNOSIS — R51 Headache: Secondary | ICD-10-CM | POA: Diagnosis not present

## 2018-01-29 DIAGNOSIS — H9311 Tinnitus, right ear: Secondary | ICD-10-CM | POA: Insufficient documentation

## 2018-01-29 MED ORDER — GADOBUTROL 1 MMOL/ML IV SOLN
7.5000 mL | Freq: Once | INTRAVENOUS | Status: AC | PRN
  Administered 2018-01-29: 7.5 mL via INTRAVENOUS

## 2018-02-03 DIAGNOSIS — Z6831 Body mass index (BMI) 31.0-31.9, adult: Secondary | ICD-10-CM | POA: Diagnosis not present

## 2018-02-03 DIAGNOSIS — Z01419 Encounter for gynecological examination (general) (routine) without abnormal findings: Secondary | ICD-10-CM | POA: Diagnosis not present

## 2018-02-03 DIAGNOSIS — Z1231 Encounter for screening mammogram for malignant neoplasm of breast: Secondary | ICD-10-CM | POA: Diagnosis not present

## 2018-02-06 DIAGNOSIS — H9319 Tinnitus, unspecified ear: Secondary | ICD-10-CM | POA: Diagnosis not present

## 2018-02-16 ENCOUNTER — Encounter: Payer: Self-pay | Admitting: *Deleted

## 2018-02-16 ENCOUNTER — Encounter: Payer: Self-pay | Admitting: Neurology

## 2018-02-16 ENCOUNTER — Ambulatory Visit: Payer: 59 | Admitting: Neurology

## 2018-02-16 VITALS — BP 124/74 | HR 74 | Ht 63.5 in | Wt 183.0 lb

## 2018-02-16 DIAGNOSIS — G932 Benign intracranial hypertension: Secondary | ICD-10-CM

## 2018-02-16 DIAGNOSIS — G950 Syringomyelia and syringobulbia: Secondary | ICD-10-CM

## 2018-02-16 DIAGNOSIS — R202 Paresthesia of skin: Secondary | ICD-10-CM | POA: Diagnosis not present

## 2018-02-16 DIAGNOSIS — G935 Compression of brain: Secondary | ICD-10-CM | POA: Insufficient documentation

## 2018-02-16 MED ORDER — ACETAZOLAMIDE 250 MG PO TABS: 250.0000 mg | ORAL_TABLET | Freq: Three times a day (TID) | ORAL | 4 refills | Status: AC | PRN

## 2018-02-16 NOTE — Progress Notes (Signed)
[facility]    Provider:  Dr Jaynee Eagles Referring Provider: Dr. Carloyn Manner Primary Care Physician:  Libby Maw, MD  CC:  "had an MRI due to Tinnitus/hyperacussis in right ear, having other intermittent symproms such as paresthesias, weakness"  HPI:  Sharon Carey is a 43 y.o. female here as requested by Dr. Carloyn Manner for dizziness possible chiari malformation. PMHx scoliosis, reactive airway disease, dry eye, depression, adhd.  She has had Tinnitus for several years, episodic 15 minutes, unclear triggers. Recently she had a roar and fullness in her ear on the right and felt vibrations down her right leg on November 4th, she was seen by ENT, she says he did not find anything to cause it. She denies any dizziness, no new headaches and they are mild and infrequent, happened 11/26 -11/29 constant roaring, decreased hearing on the right, fullness in the ear, roaring but not pulsatile, last time it happened was 12-2 and mid December. But she gets her Tinnitus in the early morning when laying down. No changes with sudden coughing, sneezing, or straining, no new neck pain she has some tingling but nothing significant, no balance issues, she had weakness in the left last spring once and resolved in 12 hours, no difficulty swallowing or speaking, no vomiting,  She does have scoliosis and kyphosis,    Reviewed notes, labs and imaging from outside physicians, which showed:   IMPRESSION: Personally reviewed imaging and agree with the following. Would add tonsils are rounded and not peg-like. Partially effaced csf fluid.  1. No acute intracranial process. 2. Borderline findings of Chiari 1 malformation. 3. Normal MRI of the internal auditory canals with and without Contrast.  ReviewedDr. Carloyn Manner notes.  She was seen for tinnitus subjective in the right ear, abnormal auditory perception on the right.  Audiologic testing was ordered.  The ordering physician  wasDr. Jeannie Fend Vaught audiogram findings were normal right ear normal left ear.  Word recognition with 96% discrimination score, excellent, bilaterally.  She had a high pitch roaring.  Intermittent for about 20 minutes.  Ongoing for years worsening in the last month.  Speech reception threshold 5 dB bilaterally.   Review of Systems: Patient complains of symptoms per HPI as well as the following symptoms: headache, weakness, fatigue. Pertinent negatives and positives per HPI. All others negative.   Social History   Socioeconomic History  . Marital status: Married    Spouse name: Not on file  . Number of children: 3  . Years of education: Not on file  . Highest education level: Professional school degree (e.g., MD, DDS, DVM, JD)  Occupational History  . Not on file  Social Needs  . Financial resource strain: Not on file  . Food insecurity:    Worry: Not on file    Inability: Not on file  . Transportation needs:    Medical: Not on file    Non-medical: Not on file  Tobacco Use  . Smoking status: Never Smoker  . Smokeless tobacco: Never Used  Substance and Sexual Activity  . Alcohol use: Yes    Alcohol/week: 4.0 - 5.0 standard drinks    Types: 4 - 5 Standard drinks or equivalent per week  . Drug use: Never  . Sexual activity: Not on file  Lifestyle  . Physical activity:    Days per week: Not on file    Minutes per session: Not on file  . Stress: Not on file  Relationships  . Social connections:  Talks on phone: Not on file    Gets together: Not on file    Attends religious service: Not on file    Active member of club or organization: Not on file    Attends meetings of clubs or organizations: Not on file    Relationship status: Not on file  . Intimate partner violence:    Fear of current or ex partner: Not on file    Emotionally abused: Not on file    Physically abused: Not on file    Forced sexual activity: Not on file  Other Topics Concern  . Not on file  Social  History Narrative   Lives at home with husband and 3 boys   Right handed   Caffeine: 20 oz diet soda x 3 daily    Family History  Problem Relation Age of Onset  . Heart attack Mother   . Heart attack Father     Past Medical History:  Diagnosis Date  . ADHD   . Depression   . Dry eye    bilateral  . Kyphosis   . Reactive airway disease   . Scoliosis    confirmed on MRI    Past Surgical History:  Procedure Laterality Date  . CESAREAN SECTION      2006 and 2011    Current Outpatient Medications  Medication Sig Dispense Refill  . albuterol (PROVENTIL HFA;VENTOLIN HFA) 108 (90 Base) MCG/ACT inhaler Inhale 2 puffs into the lungs every 6 (six) hours as needed for wheezing or shortness of breath. 1 Inhaler 3  . bimatoprost (LATISSE) 0.03 % ophthalmic solution Place 1 drop into both eyes at bedtime.    . fluticasone (FLONASE) 50 MCG/ACT nasal spray Place 2 sprays into both nostrils daily. 16 g 6  . levonorgestrel (MIRENA) 20 MCG/24HR IUD 1 each by Intrauterine route once.    . methocarbamol (ROBAXIN) 500 MG tablet Take 1 tablet (500 mg total) by mouth every 8 (eight) hours as needed for muscle spasms. 60 tablet 0  . XIIDRA 5 % SOLN Apply 1 drop to eye daily.  6  . acetaZOLAMIDE (DIAMOX) 250 MG tablet Take 1 tablet (250 mg total) by mouth 3 (three) times daily as needed. 90 tablet 4   No current facility-administered medications for this visit.     Allergies as of 02/16/2018 - Review Complete 02/16/2018  Allergen Reaction Noted  . Amoxicillin  09/25/2015    Vitals: BP 124/74 (BP Location: Right Arm, Patient Position: Sitting)   Pulse 74   Ht 5' 3.5" (1.613 m)   Wt 183 lb (83 kg)   BMI 31.91 kg/m  Last Weight:  Wt Readings from Last 1 Encounters:  02/16/18 183 lb (83 kg)   Last Height:   Ht Readings from Last 1 Encounters:  02/16/18 5' 3.5" (1.613 m)    Physical exam: Exam: Gen: NAD, conversant, well nourised, obese, well groomed                     CV: RRR, no  MRG. No Carotid Bruits. No peripheral edema, warm, nontender Eyes: Conjunctivae clear without exudates or hemorrhage  Neuro: Detailed Neurologic Exam  Speech:    Speech is normal; fluent and spontaneous with normal comprehension.  Cognition:    The patient is oriented to person, place, and time;     recent and remote memory intact;     language fluent;     normal attention, concentration,     fund of knowledge Cranial  Nerves:    The pupils are equal, round, and reactive to light. The fundi are normal and spontaneous venous pulsations are present. Visual fields are full to finger confrontation. Extraocular movements are intact. Trigeminal sensation is intact and the muscles of mastication are normal. The face is symmetric. The palate elevates in the midline. Hearing intact. Voice is normal. Shoulder shrug is normal. The tongue has normal motion without fasciculations.   Coordination:    Normal finger to nose and heel to shin. Normal rapid alternating movements.   Gait:    Heel-toe and tandem gait are normal.   Motor Observation:    No asymmetry, no atrophy, and no involuntary movements noted. Tone:    Normal muscle tone.    Posture:    Posture is normal. normal erect    Strength:    Strength is V/V in the upper and lower limbs.      Sensation: intact to LT     Reflex Exam:  DTR's:    Deep tendon reflexes in the upper and lower extremities are normal bilaterally.   Toes:    The toes are downgoing bilaterally.   Clonus:    Clonus is absent.      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 But will need to follow clinically and reimage if symptoms worsen or for any new symptoms (reviewed with patient what to look for)  - MRI cervical spine to evaluate for syrinx in the spinal cord with chiari due to right arm and left hand paresthesias and right arm weakness  - emg/ncs of the bilateral uppers (left hand symptoms)   Orders Placed This Encounter  Procedures  . MR CERVICAL SPINE WO CONTRAST  . NCV with EMG(electromyography)   Meds ordered this encounter  Medications  . acetaZOLAMIDE (DIAMOX) 250 MG tablet    Sig: Take 1 tablet (250 mg total) by mouth 3 (three) times daily as needed.    Dispense:  90 tablet    Refill:  [place], MD  [facility] [address] [place], [place] [address]  Phone [phone] Fax [phone]

## 2018-02-16 NOTE — Patient Instructions (Addendum)
Acetazolamide tid prn Emg/ncs (schedule up front) MRI cervical spine  Acetazolamide tablets What is this medicine? ACETAZOLAMIDE (a set a ZOLE a mide) is used to treat glaucoma and some seizure disorders. It may be used to treat edema or swelling from heart failure or from other medicines. This medicine is also used to treat and to prevent altitude or mountain sickness. This medicine may be used for other purposes; ask your health care provider or pharmacist if you have questions. COMMON BRAND NAME(S): Diamox What should I tell my health care provider before I take this medicine? They need to know if you have any of these conditions: -diabetes -kidney disease -liver disease -lung disease -an unusual or allergic reaction to acetazolamide, sulfa drugs, other medicines, foods, dyes, or preservatives -pregnant or trying to get pregnant -breast-feeding How should I use this medicine? Take this medicine by mouth with a glass of water. Follow the directions on the prescription label. Take this medicine with food if it upsets your stomach. Take your doses at regular intervals. Do not take your medicine more often than directed. Do not stop taking except on your doctor's advice. Talk to your pediatrician regarding the use of this medicine in children. Special care may be needed. Patients over 41 years old may have a stronger reaction and need a smaller dose. Overdosage: If you think you have taken too much of this medicine contact a poison control center or emergency room at once. NOTE: This medicine is only for you. Do not share this medicine with others. What if I miss a dose? If you miss a dose, take it as soon as you can. If it is almost time for your next dose, take only that dose. Do not take double or extra doses. What may interact with this medicine? Do not take this medicine with any of the following medications: -methazolamide This medicine may also interact with the following  medications: -aspirin and aspirin-like medicines -cyclosporine -lithium -medicine for diabetes -methenamine -other diuretics -phenytoin -primidone -quinidine -sodium bicarbonate -stimulant medicines like dextroamphetamine This list may not describe all possible interactions. Give your health care provider a list of all the medicines, herbs, non-prescription drugs, or dietary supplements you use. Also tell them if you smoke, drink alcohol, or use illegal drugs. Some items may interact with your medicine. What should I watch for while using this medicine? Visit your doctor or health care professional for regular checks on your progress. You will need blood work done regularly. If you are diabetic, check your blood sugar as directed. You may need to be on a special diet while taking this medicine. Ask your doctor. Also, ask how many glasses of fluid you need to drink a day. You must not get dehydrated. You may get drowsy or dizzy. Do not drive, use machinery, or do anything that needs mental alertness until you know how this medicine affects you. Do not stand or sit up quickly, especially if you are an older patient. This reduces the risk of dizzy or fainting spells. This medicine can make you more sensitive to the sun. Keep out of the sun. If you cannot avoid being in the sun, wear protective clothing and use sunscreen. Do not use sun lamps or tanning beds/booths. What side effects may I notice from receiving this medicine? Side effects that you should report to your doctor or health care professional as soon as possible: -allergic reactions like skin rash, itching or hives, swelling of the face, lips, or tongue -breathing problems -  confusion, depression -dark urine -fever -numbness, tingling in hands or feet -redness, blistering, peeling or loosening of the skin, including inside the mouth -ringing in the ears -seizures -unusually weak or tired -yellowing of the eyes or skin Side  effects that usually do not require medical attention (report to your doctor or health care professional if they continue or are bothersome): -change in taste -diarrhea -headache -loss of appetite -nausea, vomiting -passing urine more often This list may not describe all possible side effects. Call your doctor for medical advice about side effects. You may report side effects to FDA at 1-800-FDA-1088. Where should I keep my medicine? Keep out of the reach of children. Store at room temperature between 20 and 25 degrees C (68 and 77 degrees F). Throw away any unused medicine after the expiration date. NOTE: This sheet is a summary. It may not cover all possible information. If you have questions about this medicine, talk to your doctor, pharmacist, or health care provider.  2019 Elsevier/Gold Standard (2007-04-15 10:59:40)  Chiari Malformation  Chiari malformation (CM) is a type of brain abnormality that affects the parts of the brain called the cerebellum and the brain stem. The cerebellum is important for balance and the brain stem is important for basic body functions, such as breathing and swallowing. Normally, the cerebellum is located in a space at the back of the skull, just above the opening in the skull (foramen magnum)where the spinal cord meets the brain stem. With CM, part of the cerebellum is located below the foramen magnum instead. The malformation can be mild with no or few symptoms, or it can be severe. CM can cause neck pain, headaches, balance problems, and other symptoms. What are the causes? CM is a condition that a person is born with (congenital). In rare cases, CM may also develop later in life (acquired CM or secondary CM). These cases may be caused by a leak of the fluid (cerebrospinal fluid) around the brain and spinal cord, leading to low pressure. In acquired or secondary CM, abnormal pressure develops in the brain. This pushes the cerebellum down into the foramen  magnum. What increases the risk? The following factors may make you more likely to develop this condition:  Being female.  Having a family history of CM. What are the signs or symptoms? Symptoms of this condition may vary depending on the severity of your CM. In some cases, you may not have any symptoms. In other cases, symptoms may come and go. The most common symptom is a severe headache in the back of the head. The headache:  May come and go.  May spread to your neck and shoulders.  May be worse when you cough, sneeze, or strain. Other symptoms include:  Difficulty balancing.  Loss of coordination.  Trouble swallowing or speaking.  Muscle weakness.  Feeling dizzy.  Ringing in the ears.  Fainting.  Trouble sleeping.  Fatigue.  Tingling or burning sensations in the fingers or toes.  Hearing problems.  Vision problems.  Vomiting.  Depression.  Seizures, in severe cases. How is this diagnosed? This condition may be evaluated with a medical history and physical exam. This may include tests to check your balance and nerves (neurological exam). You may also have imaging tests, such as CT scan or MRI. How is this treated? Treatment for this condition depends on the severity of your symptoms. You may be treated with:  Surgery to prevent the malformation from getting worse, or to treat severe symptoms or  symptoms that are getting worse.  Medicines or alternative treatments to relieve headaches or neck pain. If you do not have symptoms, you may not need treatment. Follow these instructions at home: If you have seizures:  Do not drive, swim, or do any other activities that would be dangerous if you had a seizure. Wait until your health care provider says it is safe to do them.  Avoid any substances that may prevent your medicine from working properly, such as alcohol.  Check with your local Department of Motor Vehicles [facility]) to find out about local driving laws.  Each state has specific rules about when you can legally return to driving.  Get enough rest. Lack of sleep can make seizures more likely to occur. Medicines  Take over-the-counter and prescription medicines only as told by your health care provider.  Do not drive or use heavy machinery while taking prescription pain medicine.  If you are taking blood pressure or heart medicine, get up slowly and take several minutes to sit and then stand. This can reduce dizziness. General instructions  If you feel like you might faint: ? Lie down right away and raise (elevate) your feet above the level of your heart. ? Breathe deeply and steadily. Wait until all of the symptoms have passed.  Ask your health care provider which activities are safe for you, and if you have any activity restrictions.  Do not use any products that contain nicotine or tobacco, such as cigarettes and e-cigarettes. If you need help quitting, ask your health care provider.  Drink enough fluid to keep your urine pale yellow.  Consider joining a CM support group.  Keep all follow-up visits as told by your health care provider. This is important. Contact a health care provider if:  You have new symptoms.  Your symptoms get worse. Get help right away if:  You have seizures that are new or different from other seizures that you have had.  You develop weakness or numbness in one or all of your limbs.  You develop dizziness, slurred speech, double vision, weakness, or numbness with a severe headache. Summary  A Chiari malformation is a condition in which part of the cerebellum moves through the foramen magnum.  The malformation can be mild with no symptoms, or it can be severe.  In some patients, no treatment is needed. In others, medicines are used to treat headaches. Surgery is done in the worst of cases. This information is not intended to replace advice given to you by your health care provider. Make sure you  discuss any questions you have with your health care provider. Document Released: 01/11/2002 Document Revised: 01/23/2017 Document Reviewed: 10/30/2016 Elsevier Interactive Patient Education  2019 Arthur Syndrome  Carpal tunnel syndrome is a condition that causes pain in your hand and arm. The carpal tunnel is a narrow area that is on the palm side of your wrist. Repeated wrist motion or certain diseases may cause swelling in the tunnel. This swelling can pinch the main nerve in the wrist (median nerve). What are the causes? This condition may be caused by:  Repeated wrist motions.  Wrist injuries.  Arthritis.  A sac of fluid (cyst) or abnormal growth (tumor) in the carpal tunnel.  Fluid buildup during pregnancy. Sometimes the cause is not known. What increases the risk? The following factors may make you more likely to develop this condition:  Having a job in which you move your wrist in the same  way many times. This includes jobs like being a Software engineer or a Scientist, water quality.  Being a woman.  Having other health conditions, such as: ? Diabetes. ? Obesity. ? A thyroid gland that is not active enough (hypothyroidism). ? Kidney failure. What are the signs or symptoms? Symptoms of this condition include:  A tingling feeling in your fingers.  Tingling or a loss of feeling (numbness) in your hand.  Pain in your entire arm. This pain may get worse when you bend your wrist and elbow for a long time.  Pain in your wrist that goes up your arm to your shoulder.  Pain that goes down into your palm or fingers.  A weak feeling in your hands. You may find it hard to grab and hold items. You may feel worse at night. How is this diagnosed? This condition is diagnosed with a medical history and physical exam. You may also have tests, such as:  Electromyogram (EMG). This test checks the signals that the nerves send to the muscles.  Nerve conduction study. This test  checks how well signals pass through your nerves.  Imaging tests, such as X-rays, ultrasound, and MRI. These tests check for what might be the cause of your condition. How is this treated? This condition may be treated with:  Lifestyle changes. You will be asked to stop or change the activity that caused your problem.  Doing exercise and activities that make bones and muscles stronger (physical therapy).  Learning how to use your hand again (occupational therapy).  Medicines for pain and swelling (inflammation). You may have injections in your wrist.  A wrist splint.  Surgery. Follow these instructions at home: If you have a splint:  Wear the splint as told by your doctor. Remove it only as told by your doctor.  Loosen the splint if your fingers: ? Tingle. ? Lose feeling (become numb). ? Turn cold and blue.  Keep the splint clean.  If the splint is not waterproof: ? Do not let it get wet. ? Cover it with a watertight covering when you take a bath or a shower. Managing pain, stiffness, and swelling   If told, put ice on the painful area: ? If you have a removable splint, remove it as told by your doctor. ? Put ice in a plastic bag. ? Place a towel between your skin and the bag. ? Leave the ice on for 20 minutes, 2-3 times per day. General instructions  Take over-the-counter and prescription medicines only as told by your doctor.  Rest your wrist from any activity that may cause pain. If needed, talk with your boss at work about changes that can help your wrist heal.  Do any exercises as told by your doctor, physical therapist, or occupational therapist.  Keep all follow-up visits as told by your doctor. This is important. Contact a doctor if:  You have new symptoms.  Medicine does not help your pain.  Your symptoms get worse. Get help right away if:  You have very bad numbness or tingling in your wrist or hand. Summary  Carpal tunnel syndrome is a condition  that causes pain in your hand and arm.  It is often caused by repeated wrist motions.  Lifestyle changes and medicines are used to treat this problem. Surgery may help in very bad cases.  Follow your doctor's instructions about wearing a splint, resting your wrist, keeping follow-up visits, and calling for help. This information is not intended to replace advice given to you  by your health care provider. Make sure you discuss any questions you have with your health care provider. Document Released: 01/10/2011 Document Revised: 05/30/2017 Document Reviewed: 05/30/2017 Elsevier Interactive Patient Education  2019 [employer].

## 2018-02-17 ENCOUNTER — Other Ambulatory Visit: Payer: Self-pay | Admitting: Family Medicine

## 2018-02-17 ENCOUNTER — Telehealth: Payer: Self-pay | Admitting: Neurology

## 2018-02-17 DIAGNOSIS — M546 Pain in thoracic spine: Secondary | ICD-10-CM

## 2018-02-17 NOTE — Telephone Encounter (Signed)
MR Cervical spine wo contrast Dr. Lianne Bushy [facility] Auth: [place] Ref # [id number]. Patient is scheduled at [facility] for 02/24/18

## 2018-02-24 ENCOUNTER — Ambulatory Visit: Payer: 59

## 2018-02-24 DIAGNOSIS — R202 Paresthesia of skin: Secondary | ICD-10-CM | POA: Diagnosis not present

## 2018-02-24 DIAGNOSIS — G950 Syringomyelia and syringobulbia: Secondary | ICD-10-CM

## 2018-02-24 DIAGNOSIS — G935 Compression of brain: Secondary | ICD-10-CM

## 2018-04-09 ENCOUNTER — Ambulatory Visit: Payer: 59 | Admitting: Neurology

## 2018-04-09 ENCOUNTER — Ambulatory Visit (INDEPENDENT_AMBULATORY_CARE_PROVIDER_SITE_OTHER): Payer: 59 | Admitting: Neurology

## 2018-04-09 DIAGNOSIS — R202 Paresthesia of skin: Secondary | ICD-10-CM

## 2018-04-09 DIAGNOSIS — Z0289 Encounter for other administrative examinations: Secondary | ICD-10-CM

## 2018-04-09 NOTE — Progress Notes (Signed)
Full Name: Sharon Carey Gender: Female MRN #: [id number] Date of Birth: December 15, 1975    Visit Date: 04/09/2018 08:19 Age: 43 Years 2 Months Old Examining Physician: Sarina Ill, MD   History: Arm paresthesias and right arm weakness. She has a borderline chiari malformation. Reviewed MRI of the cervical spine with patient today which did not show evidence of syringomyelia or other etiology for her UE symptoms.   Summary: EMG/NCS was performed on the bilateral upper extremities.    Conclusion: This is a normal study of the upper extremities. No evidence for mononeuropathy, polyneuropathy or radiculopathy.  Sarina Ill M.D.  [facility] [place], [place] [address] Tel: [phone] Fax: [phone]        [facility]    Nerve / Sites Muscle Latency Ref. Amplitude Ref. Rel Amp Segments Distance Velocity Ref. Area    ms ms mV mV %  cm m/s m/s mVms  R Median - APB     Wrist APB 3.1 ?4.4 10.0 ?4.0 100 Wrist - APB 7   35.3     Upper arm APB 6.5  9.8  97.9 Upper arm - Wrist 19 56 ?49 34.9  L Median - APB     Wrist APB 3.3 ?4.4 8.7 ?4.0 100 Wrist - APB 7   30.9     Upper arm APB 6.8  8.3  94.8 Upper arm - Wrist 19 54 ?49 29.4  R Ulnar - ADM     Wrist ADM 2.7 ?3.3 6.5 ?6.0 100 Wrist - ADM 7   21.6     B.Elbow ADM 5.5  6.1  93.8 B.Elbow - Wrist 18 65 ?49 21.9     A.Elbow ADM 7.2  6.5  106 A.Elbow - B.Elbow 10 58 ?49 23.0         A.Elbow - Wrist      L Ulnar - ADM     Wrist ADM 2.6 ?3.3 6.1 ?6.0 100 Wrist - ADM 7   18.5     B.Elbow ADM 5.6  6.2  100 B.Elbow - Wrist 18 59 ?49 20.3     A.Elbow ADM 7.3  5.7  92.1 A.Elbow - B.Elbow 10 58 ?49 18.9         A.Elbow - Wrist                 SNC    Nerve / Sites Rec. Site Peak Lat Ref.  Amp Ref. Segments Distance Peak Diff Ref.    ms ms V V  cm ms ms  R Median, Ulnar - Transcarpal comparison     Median Palm Wrist 2.0 ?2.2 56 ?35 Median Palm - Wrist 8       Ulnar Palm Wrist 1.9 ?2.2 20 ?12 Ulnar Palm - Wrist  8          Median Palm - Ulnar Palm  0.1 ?0.4  L Median, Ulnar - Transcarpal comparison     Median Palm Wrist 2.1 ?2.2 46 ?35 Median Palm - Wrist 8       Ulnar Palm Wrist 1.9 ?2.2 22 ?12 Ulnar Palm - Wrist 8          Median Palm - Ulnar Palm  0.3 ?0.4  R Median - Orthodromic (Dig II, Mid palm)     Dig II Wrist 3.1 ?3.4 14 ?10 Dig II - Wrist 13    L Median - Orthodromic (Dig II, Mid palm)     Dig II Wrist  3.0 ?3.4 12 ?10 Dig II - Wrist 13    R Ulnar - Orthodromic, (Dig V, Mid palm)     Dig V Wrist 2.7 ?3.1 12 ?5 Dig V - Wrist 11    L Ulnar - Orthodromic, (Dig V, Mid palm)     Dig V Wrist 2.7 ?3.1 10 ?5 Dig V - Wrist 69                   F  Wave    Nerve F Lat Ref.   ms ms  R Ulnar - ADM 25.6 ?32.0  L Ulnar - ADM 25.0 ?32.0         EMG full       EMG Summary Table    Spontaneous MUAP Recruitment  Muscle IA Fib PSW Fasc Other Amp Dur. Poly Pattern  L. Deltoid Normal None None None _______ Normal Normal Normal Normal  L. Triceps brachii Normal None None None _______ Normal Normal Normal Normal  L. Pronator teres Normal None None None _______ Normal Normal Normal Normal  L. First dorsal interosseous Normal None None None _______ Normal Normal Normal Normal  L. Opponens pollicis Normal None None None _______ Normal Normal Normal Normal  L. Cervical paraspinals Normal None None None _______ Normal Normal Normal Normal  R. Cervical paraspinals Normal None None None _______ Normal Normal Normal Normal

## 2018-04-09 NOTE — Progress Notes (Signed)
See procedure note.

## 2018-04-13 NOTE — Procedures (Signed)
Full Name: Sharon Carey Gender: Female MRN #: [id number] Date of Birth: [birth year]    Visit Date: 04/09/2018 08:19 Age: 43 Years 2 Months Old Examining Physician: Sarina Ill, MD   History: Arm paresthesias and right arm weakness. She has a borderline chiari malformation. Reviewed MRI of the cervical spine with patient today which did not show evidence of syringomyelia or other etiology for her UE symptoms.   Summary: EMG/NCS was performed on the bilateral upper extremities.    Conclusion: This is a normal study of the upper extremities. No evidence for mononeuropathy, polyneuropathy or radiculopathy.  Sarina Ill M.D.  [facility] [place], [place] [address] Tel: [phone] Fax: [phone]        [facility]    Nerve / Sites Muscle Latency Ref. Amplitude Ref. Rel Amp Segments Distance Velocity Ref. Area    ms ms mV mV %  cm m/s m/s mVms  R Median - APB     Wrist APB 3.1 ?4.4 10.0 ?4.0 100 Wrist - APB 7   35.3     Upper arm APB 6.5  9.8  97.9 Upper arm - Wrist 19 56 ?49 34.9  L Median - APB     Wrist APB 3.3 ?4.4 8.7 ?4.0 100 Wrist - APB 7   30.9     Upper arm APB 6.8  8.3  94.8 Upper arm - Wrist 19 54 ?49 29.4  R Ulnar - ADM     Wrist ADM 2.7 ?3.3 6.5 ?6.0 100 Wrist - ADM 7   21.6     B.Elbow ADM 5.5  6.1  93.8 B.Elbow - Wrist 18 65 ?49 21.9     A.Elbow ADM 7.2  6.5  106 A.Elbow - B.Elbow 10 58 ?49 23.0         A.Elbow - Wrist      L Ulnar - ADM     Wrist ADM 2.6 ?3.3 6.1 ?6.0 100 Wrist - ADM 7   18.5     B.Elbow ADM 5.6  6.2  100 B.Elbow - Wrist 18 59 ?49 20.3     A.Elbow ADM 7.3  5.7  92.1 A.Elbow - B.Elbow 10 58 ?49 18.9         A.Elbow - Wrist                 SNC    Nerve / Sites Rec. Site Peak Lat Ref.  Amp Ref. Segments Distance Peak Diff Ref.    ms ms V V  cm ms ms  R Median, Ulnar - Transcarpal comparison     Median Palm Wrist 2.0 ?2.2 56 ?35 Median Palm - Wrist 8       Ulnar Palm Wrist 1.9 ?2.2 20 ?12 Ulnar Palm - Wrist  8          Median Palm - Ulnar Palm  0.1 ?0.4  L Median, Ulnar - Transcarpal comparison     Median Palm Wrist 2.1 ?2.2 46 ?35 Median Palm - Wrist 8       Ulnar Palm Wrist 1.9 ?2.2 22 ?12 Ulnar Palm - Wrist 8          Median Palm - Ulnar Palm  0.3 ?0.4  R Median - Orthodromic (Dig II, Mid palm)     Dig II Wrist 3.1 ?3.4 14 ?10 Dig II - Wrist 13    L Median - Orthodromic (Dig II, Mid palm)     Dig II Wrist  3.0 ?3.4 12 ?10 Dig II - Wrist 13    R Ulnar - Orthodromic, (Dig V, Mid palm)     Dig V Wrist 2.7 ?3.1 12 ?5 Dig V - Wrist 11    L Ulnar - Orthodromic, (Dig V, Mid palm)     Dig V Wrist 2.7 ?3.1 10 ?5 Dig V - Wrist 68                   F  Wave    Nerve F Lat Ref.   ms ms  R Ulnar - ADM 25.6 ?32.0  L Ulnar - ADM 25.0 ?32.0         EMG full       EMG Summary Table    Spontaneous MUAP Recruitment  Muscle IA Fib PSW Fasc Other Amp Dur. Poly Pattern  L. Deltoid Normal None None None _______ Normal Normal Normal Normal  L. Triceps brachii Normal None None None _______ Normal Normal Normal Normal  L. Pronator teres Normal None None None _______ Normal Normal Normal Normal  L. First dorsal interosseous Normal None None None _______ Normal Normal Normal Normal  L. Opponens pollicis Normal None None None _______ Normal Normal Normal Normal  L. Cervical paraspinals Normal None None None _______ Normal Normal Normal Normal  R. Cervical paraspinals Normal None None None _______ Normal Normal Normal Normal

## 2018-04-24 ENCOUNTER — Other Ambulatory Visit: Payer: Self-pay | Admitting: Family Medicine

## 2018-04-24 DIAGNOSIS — M546 Pain in thoracic spine: Secondary | ICD-10-CM

## 2018-07-20 DIAGNOSIS — H16223 Keratoconjunctivitis sicca, not specified as Sjogren's, bilateral: Secondary | ICD-10-CM | POA: Diagnosis not present

## 2018-09-03 ENCOUNTER — Other Ambulatory Visit: Payer: Self-pay | Admitting: Family Medicine

## 2018-09-03 DIAGNOSIS — J301 Allergic rhinitis due to pollen: Secondary | ICD-10-CM

## 2018-09-03 DIAGNOSIS — J4520 Mild intermittent asthma, uncomplicated: Secondary | ICD-10-CM

## 2018-09-03 DIAGNOSIS — M546 Pain in thoracic spine: Secondary | ICD-10-CM

## 2019-01-13 ENCOUNTER — Other Ambulatory Visit: Payer: 59

## 2019-01-13 ENCOUNTER — Encounter: Payer: Self-pay | Admitting: Family Medicine

## 2019-01-13 ENCOUNTER — Ambulatory Visit (INDEPENDENT_AMBULATORY_CARE_PROVIDER_SITE_OTHER): Payer: 59 | Admitting: Family Medicine

## 2019-01-13 DIAGNOSIS — R8290 Unspecified abnormal findings in urine: Secondary | ICD-10-CM | POA: Diagnosis not present

## 2019-01-13 DIAGNOSIS — R102 Pelvic and perineal pain: Secondary | ICD-10-CM | POA: Diagnosis not present

## 2019-01-13 LAB — URINALYSIS, ROUTINE W REFLEX MICROSCOPIC
Bilirubin Urine: NEGATIVE
Hgb urine dipstick: NEGATIVE
Ketones, ur: NEGATIVE
Leukocytes,Ua: NEGATIVE
Nitrite: NEGATIVE
RBC / HPF: NONE SEEN (ref 0–?)
Specific Gravity, Urine: 1.02 (ref 1.000–1.030)
Total Protein, Urine: NEGATIVE
Urine Glucose: NEGATIVE
Urobilinogen, UA: 0.2 (ref 0.0–1.0)
WBC, UA: NONE SEEN (ref 0–?)
pH: 5.5 (ref 5.0–8.0)

## 2019-01-13 LAB — POCT URINALYSIS DIPSTICK
Bilirubin, UA: NEGATIVE
Blood, UA: NEGATIVE
Glucose, UA: NEGATIVE
Ketones, UA: NEGATIVE
Leukocytes, UA: NEGATIVE
Nitrite, UA: NEGATIVE
Protein, UA: NEGATIVE
Spec Grav, UA: 1.015 (ref 1.010–1.025)
Urobilinogen, UA: 0.2 E.U./dL
pH, UA: 5.5 (ref 5.0–8.0)

## 2019-01-13 NOTE — Progress Notes (Addendum)
Established Patient Office Visit  Subjective:  Patient ID: Sharon Carey, female    DOB: 1975-03-04  Age: 43 y.o. MRN: [id number]  CC:  Chief Complaint  Patient presents with  . Abdominal Pain    HPI Sharon Carey presents for evaluation and treatment of left lower abdominal pain.  Patient denies fevers chills nausea vomiting diarrhea upper respiratory tract symptoms.  She denies dysuria frequency urgency but her urine has had a strong smell.  Denies any unusual vaginal discharge.  Longstanding history of Mirena IUD in place.  She is married and in a stable relationship.  Distant history of ovarian cyst.  Family history of diverticulosis.  Patient has never had a colonoscopy.  She took ibuprofen and that seemed to help some.  Past Medical History:  Diagnosis Date  . ADHD   . Depression   . Dry eye    bilateral  . Kyphosis   . Reactive airway disease   . Scoliosis    confirmed on MRI    Past Surgical History:  Procedure Laterality Date  . CESAREAN SECTION      2006 and 2011    Family History  Problem Relation Age of Onset  . Heart attack Mother   . Heart attack Father     Social History   Socioeconomic History  . Marital status: Married    Spouse name: Not on file  . Number of children: 3  . Years of education: Not on file  . Highest education level: Professional school degree (e.g., MD, DDS, DVM, JD)  Occupational History  . Not on file  Social Needs  . Financial resource strain: Not on file  . Food insecurity    Worry: Not on file    Inability: Not on file  . Transportation needs    Medical: Not on file    Non-medical: Not on file  Tobacco Use  . Smoking status: Never Smoker  . Smokeless tobacco: Never Used  Substance and Sexual Activity  . Alcohol use: Yes    Alcohol/week: 4.0 - 5.0 standard drinks    Types: 4 - 5 Standard drinks or equivalent per week  . Drug use: Never  . Sexual activity: Not on file  Lifestyle  . Physical activity    Days  per week: Not on file    Minutes per session: Not on file  . Stress: Not on file  Relationships  . Social Herbalist on phone: Not on file    Gets together: Not on file    Attends religious service: Not on file    Active member of club or organization: Not on file    Attends meetings of clubs or organizations: Not on file    Relationship status: Not on file  . Intimate partner violence    Fear of current or ex partner: Not on file    Emotionally abused: Not on file    Physically abused: Not on file    Forced sexual activity: Not on file  Other Topics Concern  . Not on file  Social History Narrative   Lives at home with husband and 3 boys   Right handed   Caffeine: 20 oz diet soda x 3 daily    Outpatient Medications Prior to Visit  Medication Sig Dispense Refill  . acetaZOLAMIDE (DIAMOX) 250 MG tablet Take 1 tablet (250 mg total) by mouth 3 (three) times daily as needed. 90 tablet 4  . bimatoprost (LATISSE) 0.03 % ophthalmic  solution Place 1 drop into both eyes at bedtime.    . fluticasone (FLONASE) 50 MCG/ACT nasal spray PLACE 2 SPRAYS INTO BOTH NOSTRILS DAILY 16 g 0  . levonorgestrel (MIRENA) 20 MCG/24HR IUD 1 each by Intrauterine route once.    . methocarbamol (ROBAXIN) 500 MG tablet TAKE 1 TABLET BY MOUTH EVERY 8 HOURS AS NEEDED FOR MUSCLE SPASMS. 60 tablet 0  . VENTOLIN HFA 108 (90 Base) MCG/ACT inhaler INHALE 2 PUFFS INTO THE LUNGS EVERY 6 (SIX) HOURS AS NEEDED FOR WHEEZING OR SHORTNESS OF BREATH. 18 g 0  . XIIDRA 5 % SOLN Apply 1 drop to eye daily.  6   No facility-administered medications prior to visit.     Allergies  Allergen Reactions  . Amoxicillin     ROS Review of Systems  Constitutional: Negative for chills, diaphoresis, fatigue, fever and unexpected weight change.  HENT: Negative.   Eyes: Negative.   Respiratory: Negative.   Cardiovascular: Negative.   Gastrointestinal: Positive for abdominal pain and constipation. Negative for anal bleeding,  blood in stool, diarrhea, nausea and vomiting.  Genitourinary: Negative for difficulty urinating, dysuria and frequency.  Neurological: Negative.   Hematological: Negative.   Psychiatric/Behavioral: Negative.       Objective:    Physical Exam  Constitutional: She is oriented to person, place, and time. No distress.  Pulmonary/Chest: Effort normal.  Neurological: She is alert and oriented to person, place, and time.  Psychiatric: She has a normal mood and affect. Her behavior is normal.    There were no vitals taken for this visit. Wt Readings from Last 3 Encounters:  02/16/18 183 lb (83 kg)  12/08/17 174 lb (78.9 kg)  08/14/17 173 lb 8 oz (78.7 kg)     Health Maintenance Due  Topic Date Due  . TETANUS/TDAP  01/12/1996  . PAP SMEAR-Modifier  01/11/1998  . INFLUENZA VACCINE  09/05/2018    There are no preventive care reminders to display for this patient.  Lab Results  Component Value Date   TSH 2.29 05/15/2017   Lab Results  Component Value Date   WBC 4.0 05/15/2017   HGB 13.8 05/15/2017   HCT 39.8 05/15/2017   MCV 91.8 05/15/2017   PLT 174.0 05/15/2017   Lab Results  Component Value Date   NA 137 05/15/2017   K 3.9 05/15/2017   CO2 25 05/15/2017   GLUCOSE 86 05/15/2017   BUN 14 05/15/2017   CREATININE 0.85 05/15/2017   BILITOT 0.4 05/15/2017   ALKPHOS 70 05/15/2017   AST 20 05/15/2017   ALT 17 05/15/2017   PROT 7.1 05/15/2017   ALBUMIN 4.4 05/15/2017   CALCIUM 9.2 05/15/2017   GFR 78.60 05/15/2017   Lab Results  Component Value Date   CHOL 217 (H) 05/15/2017   Lab Results  Component Value Date   HDL 54.40 05/15/2017   Lab Results  Component Value Date   LDLCALC 144 (H) 05/15/2017   Lab Results  Component Value Date   TRIG 93.0 05/15/2017   Lab Results  Component Value Date   CHOLHDL 4 05/15/2017   No results found for: HGBA1C    Assessment & Plan:   Problem List Items Addressed This Visit    None    Visit Diagnoses    Foul  smelling urine    -  Primary   Relevant Orders   Urinalysis, Routine w reflex microscopic   Urine Culture   POCT Urinalysis Dipstick (Completed)   CBC   Pelvic pain  Relevant Orders   Urinalysis, Routine w reflex microscopic   Urine Culture   POCT Urinalysis Dipstick (Completed)   CBC      No orders of the defined types were placed in this encounter.   Follow-up: Return if symptoms worsen or fail to improve.   Virtual Visit via Video Note  I connected with Tristan Schroeder on 01/21/19 at 11:30 AM EST by a video enabled telemedicine application and verified that I am speaking with the correct person using two identifiers.  Location: Patient: home with her children Provider:   I discussed the limitations of evaluation and management by telemedicine and the availability of in person appointments. The patient expressed understanding and agreed to proceed.  History of Present Illness:    Observations/Objective:   Assessment and Plan:   Follow Up Instructions:    I discussed the assessment and treatment plan with the patient. The patient was provided an opportunity to ask questions and all were answered. The patient agreed with the plan and demonstrated an understanding of the instructions.   The patient was advised to call back or seek an in-person evaluation if the symptoms worsen or if the condition fails to improve as anticipated.  I provided 25 minutes of non-face-to-face time during this encounter.   Libby Maw, MD   Patient will work to improve her level of hydration with copious water.  She will take Metamucil for constipation.  Hopefully her abdominal pain will improve with these measures.  She will drop by at her earliest convenience for a CBC.  Follow-up next week if not improving.  Interactive video and audio telecommunications were attempted between myself and the patient. However they failed due to the patient having technical difficulties  or not having access to video capability. We continued and completed with audio only.Interactive video and audio telecommunications were attempted between myself and the patient. However they failed due to the patient having technical difficulties or not having access to video capability. We continued and completed with audio only.   Libby Maw, MD

## 2019-01-15 LAB — URINE CULTURE
MICRO NUMBER:: 1180583
Result:: NO GROWTH
SPECIMEN QUALITY:: ADEQUATE

## 2019-05-01 ENCOUNTER — Ambulatory Visit: Payer: 59 | Attending: Internal Medicine

## 2019-05-01 DIAGNOSIS — Z23 Encounter for immunization: Secondary | ICD-10-CM

## 2019-05-01 NOTE — Progress Notes (Signed)
   Covid-19 Vaccination Clinic  Name:  Sharon Carey    MRN: [id number] DOB: 12/01/1975  05/01/2019  [name] was observed post Covid-19 immunization for 15 minutes without incident. She was provided with Vaccine Information Sheet and instruction to access the V-Safe system.   [name] was instructed to call 911 with any severe reactions post vaccine: Marland Kitchen Difficulty breathing  . Swelling of face and throat  . A fast heartbeat  . A bad rash all over body  . Dizziness and weakness   Immunizations Administered    Name Date Dose VIS Date Route   Pfizer COVID-19 Vaccine 05/01/2019  2:56 PM 0.3 mL 01/15/2019 Intramuscular   Manufacturer: [place]   Lot: [id number]   [place]: [id number]

## 2019-05-25 ENCOUNTER — Ambulatory Visit: Payer: 59 | Attending: Internal Medicine

## 2019-05-25 DIAGNOSIS — Z23 Encounter for immunization: Secondary | ICD-10-CM

## 2019-05-25 NOTE — Progress Notes (Signed)
   Covid-19 Vaccination Clinic  Name:  MELISIA SIELAFF    MRN: [id number] DOB: [birth year]  05/25/2019  Ms. Rabalais was observed post Covid-19 immunization for 15 minutes without incident. She was provided with Vaccine Information Sheet and instruction to access the V-Safe system.   Ms. Cockerill was instructed to call 911 with any severe reactions post vaccine: Marland Kitchen Difficulty breathing  . Swelling of face and throat  . A fast heartbeat  . A bad rash all over body  . Dizziness and weakness   Immunizations Administered    Name Date Dose VIS Date Route   Pfizer COVID-19 Vaccine 05/25/2019  2:22 PM 0.3 mL 03/31/2018 Intramuscular   Manufacturer: [place]   Lot: [id number]   [place]: [id number]

## 2019-06-29 IMAGING — MR MR BRAIN/IAC WO/W
10 of 13 series · 26 of 48 positions shown · IV contrast (Gadavist)
Comparison: None.

CLINICAL DATA: Periodic tinnitus since [REDACTED]. Occasional
headaches and LEFT extremity tingling and weakness.

EXAM:
MRI HEAD WITHOUT AND WITH CONTRAST
TECHNIQUE: Multiplanar, multiecho pulse sequences of the brain and surrounding
structures were obtained without and with intravenous contrast.
CONTRAST:  7.5 cc Gadavist

[Series 5: T1 · sagittal · 5.0mm · 0.62mm/px · 2 of 21 slices shown (1 of 3)]
[im 1/21]
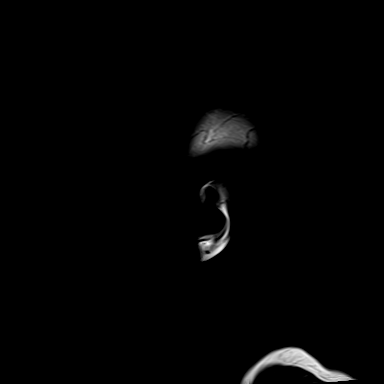
[im 21/21]
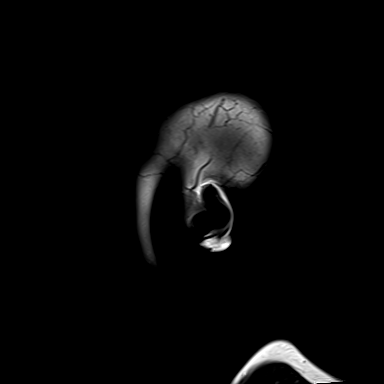

[Series 6: ax dwi_tracew · axial · 3.0mm · 0.73mm/px · z∈[-14,+142]mm · 4 of 55 slices shown]
[im 1/55]
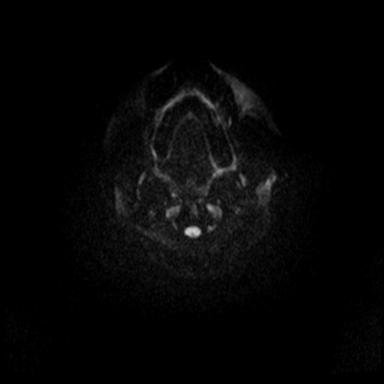
[im 19/55]
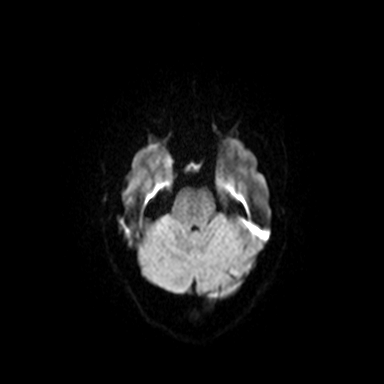
[im 37/55]
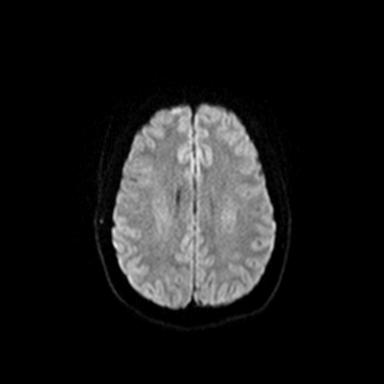
[im 55/55]
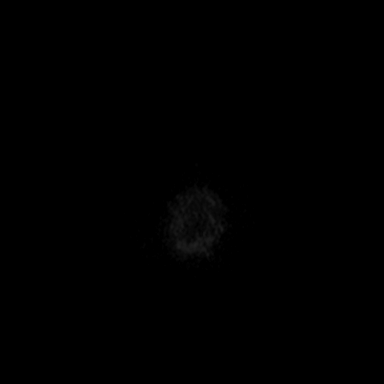

[Series 7: ax dwi_adc · axial · 3.0mm · 0.73mm/px · z∈[-14,+38]mm · 2 of 55 slices shown]
[im 1/55]
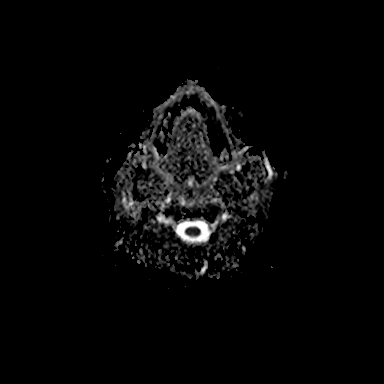
[im 19/55]
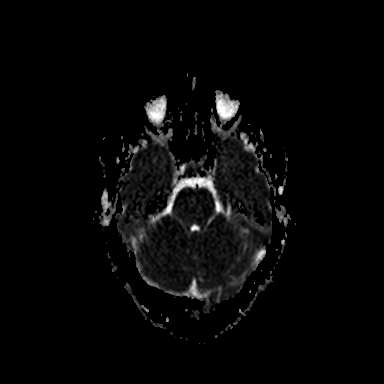

[Series 8: T2 · axial · 5.0mm · 0.53mm/px · z∈[-21,+130]mm · 2 of 27 slices shown]
[im 1/27]
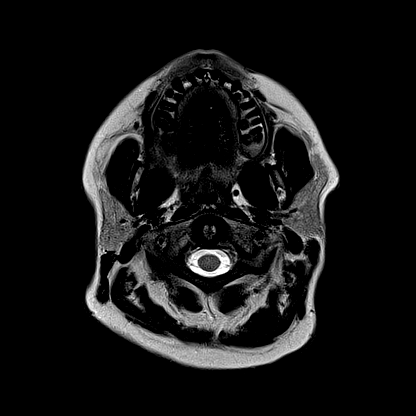
[im 27/27]
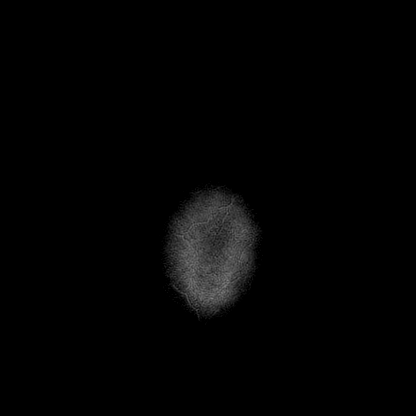

[Series 11: FLAIR · axial · 3.0mm · 0.53mm/px · z∈[-24,+133]mm · 4 of 55 slices shown]
[im 1/55]
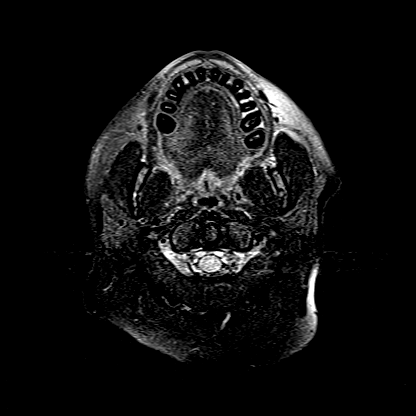
[im 19/55]
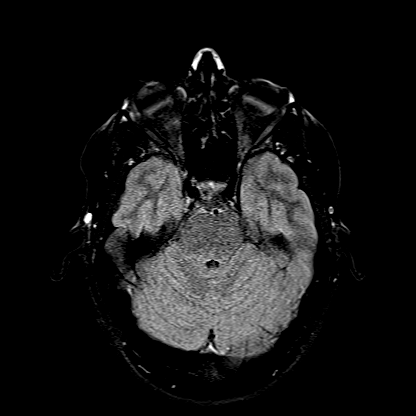
[im 37/55]
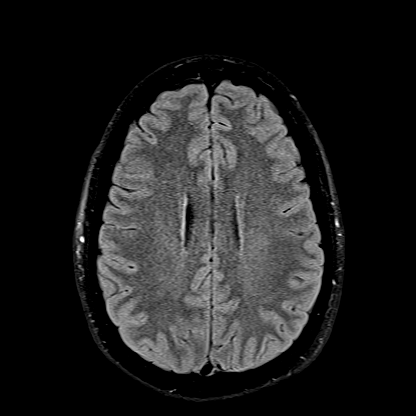
[im 55/55]
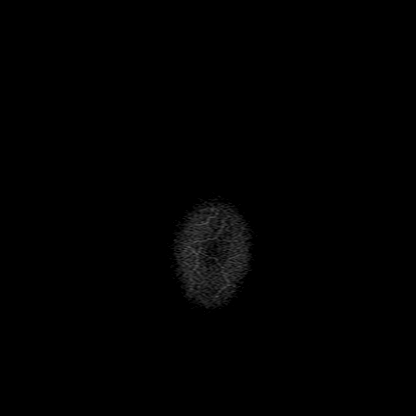

[Series 13: T1 · axial · non-contrast · 3.0mm · 0.21mm/px · 1 of 15 slices shown (2 of 3)]
[im 1/15]
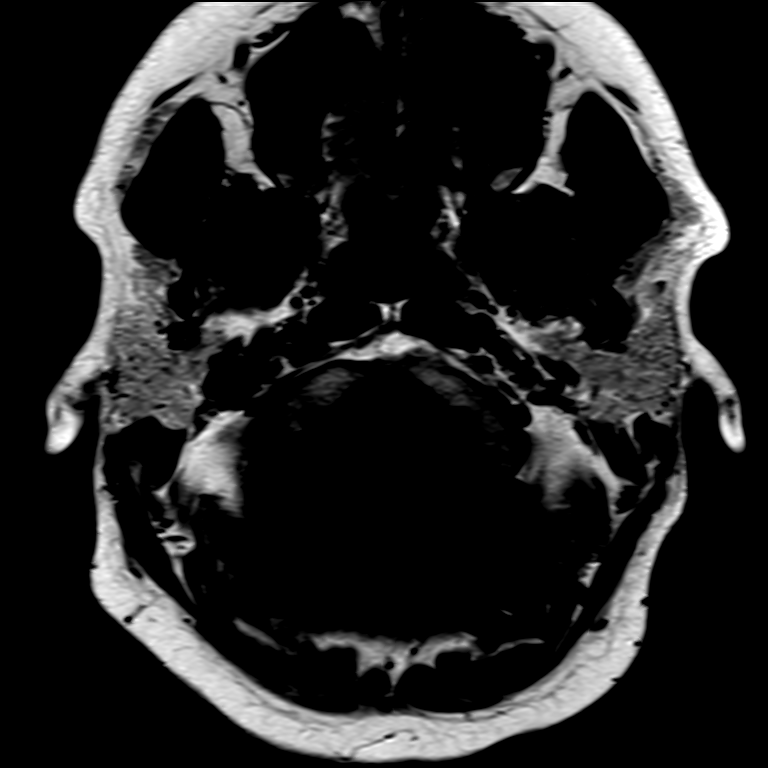

[Series 14: T1 · coronal · non-contrast · 3.0mm · 0.21mm/px · 1 of 13 slices shown (3 of 3)]
[im 1/13]
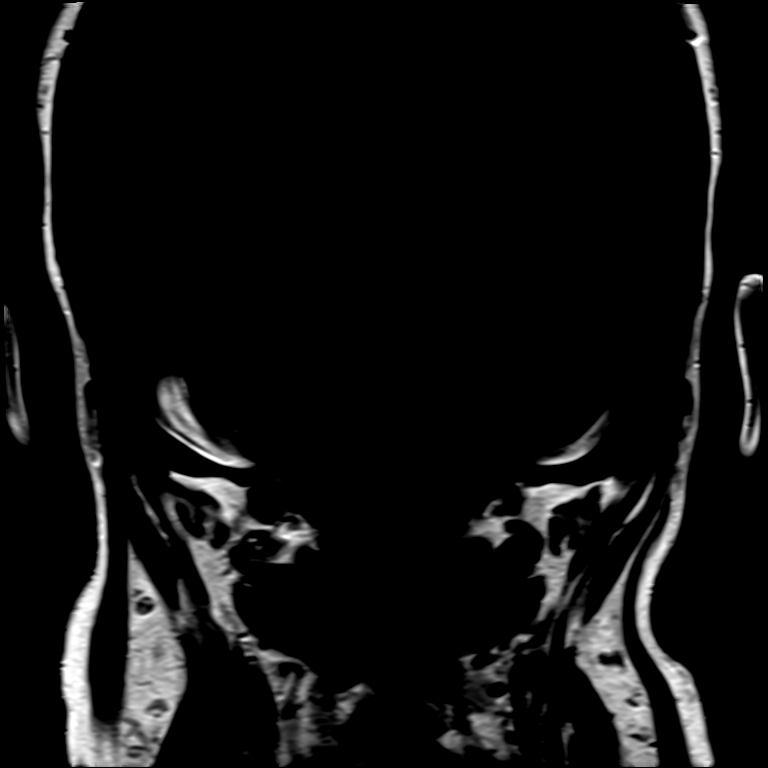

[Series 15: T1 post-contrast · axial · 3.0mm · 0.21mm/px · 1 of 15 slices shown (1 of 3)]
[im 1/15]
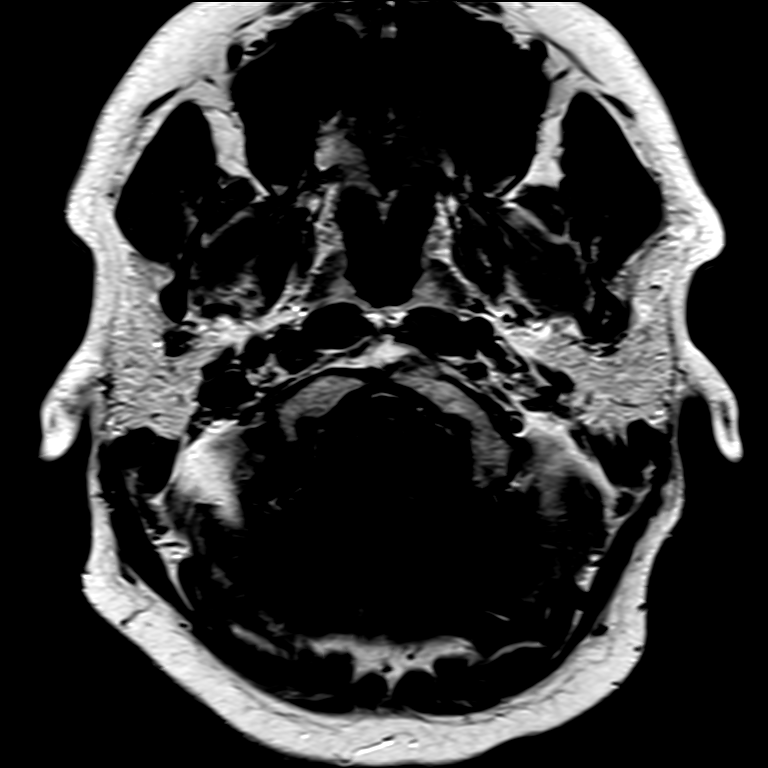

[Series 16: T1 post-contrast · coronal · 3.0mm · 0.21mm/px · 1 of 13 slices shown (2 of 3)]
[im 1/13]
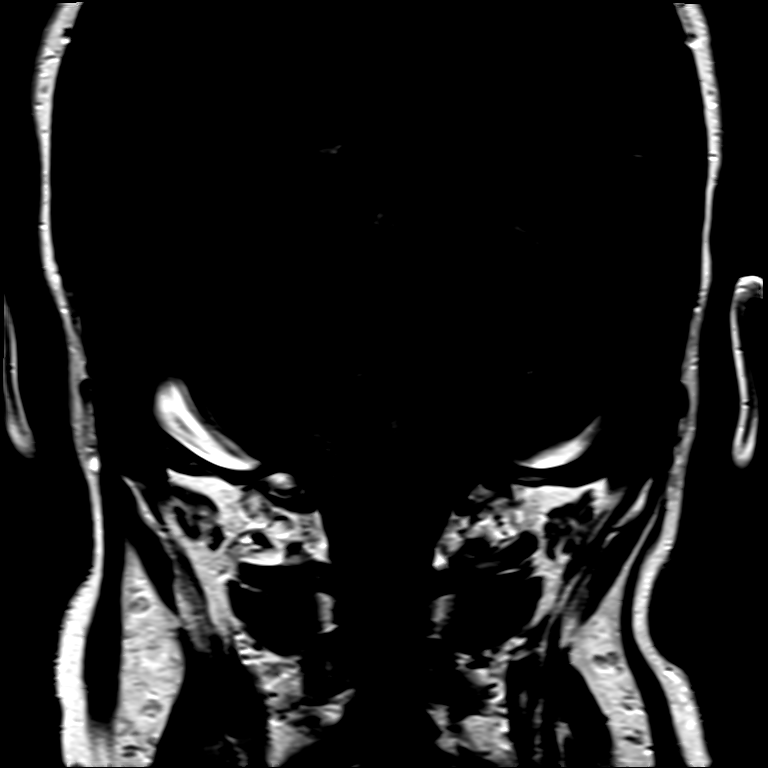

[Series 17: T1 post-contrast · axial · 1.0mm · 0.98mm/px · z∈[-26,+143]mm · 8 of 176 slices shown (3 of 3)]
[im 1/176]
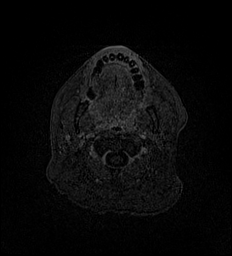
[im 27/176]
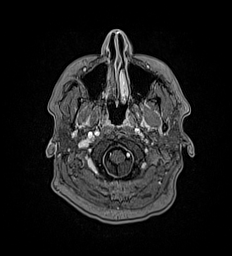
[im 54/176]
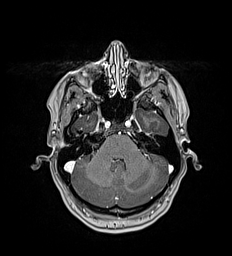
[im 81/176]
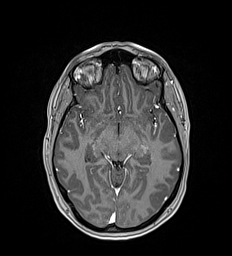
[im 95/176]
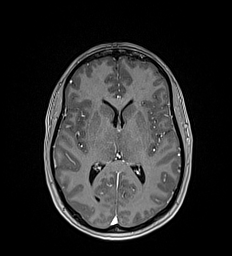
[im 122/176]
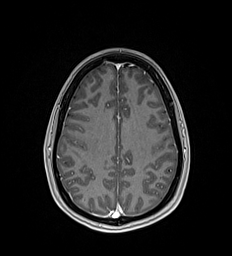
[im 149/176]
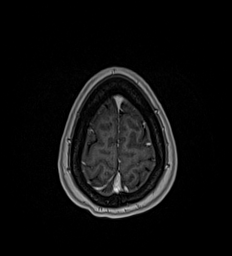
[im 176/176]
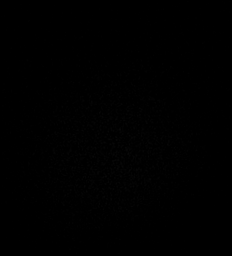

[26 of 48 positions shown; findings below may reference images not displayed]

FINDINGS: INTERNAL AUDITORY CANALS: No cerebellar pontine angle masses. Normal
course, caliber and signal of the bilateral seventh and eighth
cranial nerves without abnormal enhancement. Symmetric, normal
internal auditory canals. Normal appearance of the inner ear
structures.

INTRACRANIAL CONTENTS: No reduced diffusion to suggest acute
ischemia or hypercellular tumor. No susceptibility artifact to
suggest hemorrhage. The ventricles and sulci are normal for
patient's age. No suspicious parenchymal signal, mass lesions, mass
effect. No abnormal intraparenchymal or extra-axial enhancement. No
abnormal extra-axial fluid collections. No extra-axial
masses.Cerebellar tonsils descend 7 mm below the foramen magnum with
partially effaced cerebral spinal fluid, however tonsils are not
pointed in appearance.

VASCULAR: Normal major intracranial vascular flow voids present at
skull base.

SKULL AND UPPER CERVICAL SPINE: No abnormal sellar expansion. No
suspicious calvarial bone marrow signal. Craniocervical junction
maintained.

SINUSES/ ORBITS: The mastoid air-cells and included paranasal
sinuses are well-aerated.The included ocular globes and orbital
contents are non-suspicious.

OTHER: None.
IMPRESSION: 1. No acute intracranial process.
2. Borderline findings of Chiari 1 malformation.
3. Normal MRI of the internal auditory canals with and without
contrast.

## 2020-07-24 ENCOUNTER — Other Ambulatory Visit: Payer: Self-pay | Admitting: Obstetrics and Gynecology

## 2020-07-24 DIAGNOSIS — R928 Other abnormal and inconclusive findings on diagnostic imaging of breast: Secondary | ICD-10-CM

## 2020-08-25 ENCOUNTER — Ambulatory Visit
Admission: RE | Admit: 2020-08-25 | Discharge: 2020-08-25 | Disposition: A | Discharge status: Home / Self Care | Payer: 59 | Source: Ambulatory Visit | Attending: Obstetrics and Gynecology | Admitting: Obstetrics and Gynecology

## 2020-08-25 ENCOUNTER — Ambulatory Visit: Payer: 59

## 2020-08-25 ENCOUNTER — Other Ambulatory Visit: Payer: Self-pay

## 2020-08-25 DIAGNOSIS — R928 Other abnormal and inconclusive findings on diagnostic imaging of breast: Secondary | ICD-10-CM

## 2020-11-02 ENCOUNTER — Ambulatory Visit: Payer: 59 | Admitting: Family Medicine

## 2020-11-02 ENCOUNTER — Encounter: Payer: Self-pay | Admitting: Family Medicine

## 2020-11-02 ENCOUNTER — Other Ambulatory Visit: Payer: Self-pay

## 2020-11-02 VITALS — BP 110/76 | HR 90 | Temp 97.4°F | Ht 63.0 in | Wt 171.2 lb

## 2020-11-02 DIAGNOSIS — Z23 Encounter for immunization: Secondary | ICD-10-CM

## 2020-11-02 DIAGNOSIS — F418 Other specified anxiety disorders: Secondary | ICD-10-CM | POA: Diagnosis not present

## 2020-11-02 MED ORDER — TRAZODONE HCL 50 MG PO TABS: 25.0000 mg | ORAL_TABLET | Freq: Every evening | ORAL | 3 refills | Status: DC | PRN

## 2020-11-02 MED ORDER — FLUOXETINE HCL 10 MG PO TABS: ORAL_TABLET | ORAL | 3 refills | Status: DC

## 2020-11-02 NOTE — Progress Notes (Signed)
Established Patient Office Visit  Subjective:  Patient ID: Sharon Carey, female    DOB: 1975-11-29  Age: 45 y.o. MRN: [id number]  CC:  Chief Complaint  Patient presents with   Anxiety    Patient would like to start medications to help with anxiety and depression. Have been off medication for 5 years.     HPI Sharon Carey presents for help.  For some time now she has felt overwhelmed with her job.  There always seems to be more work to do.  Computer comes home with her nightly and she is often up early in the morning working.  She is a Nurse, adult.  She feels anxious and depressed.  Finds it difficult to stop thinking about work.  She admittedly has a perfectionist.  Describes early morning awakenings.  She has been taking 5-HT magnesium that is helped with sleep.  Things are okay at home with her husband 57 year old twins and 55 year old son.  She is interested in CBT.  Past Medical History:  Diagnosis Date   ADHD    Depression    Dry eye    bilateral   Kyphosis    Reactive airway disease    Scoliosis    confirmed on MRI    Past Surgical History:  Procedure Laterality Date   CESAREAN SECTION      2006 and 2011    Family History  Problem Relation Age of Onset   Heart attack Mother    Heart attack Father     Social History   Socioeconomic History   Marital status: Married    Spouse name: Not on file   Number of children: 3   Years of education: Not on file   Highest education level: Professional school degree (e.g., MD, DDS, DVM, JD)  Occupational History   Not on file  Tobacco Use   Smoking status: Never   Smokeless tobacco: Never  Vaping Use   Vaping Use: Never used  Substance and Sexual Activity   Alcohol use: Yes    Alcohol/week: 4.0 - 5.0 standard drinks    Types: 4 - 5 Standard drinks or equivalent per week   Drug use: Never   Sexual activity: Not on file  Other Topics Concern   Not on file  Social History Narrative   Lives at home  with husband and 3 boys   Right handed   Caffeine: 20 oz diet soda x 3 daily   Social Determinants of Health   Financial Resource Strain: Not on file  Food Insecurity: Not on file  Transportation Needs: Not on file  Physical Activity: Not on file  Stress: Not on file  Social Connections: Not on file  Intimate Partner Violence: Not on file    Outpatient Medications Prior to Visit  Medication Sig Dispense Refill   acetaZOLAMIDE (DIAMOX) 250 MG tablet Take 1 tablet (250 mg total) by mouth 3 (three) times daily as needed. 90 tablet 4   bimatoprost (LATISSE) 0.03 % ophthalmic solution Place 1 drop into both eyes at bedtime.     fluticasone (FLONASE) 50 MCG/ACT nasal spray PLACE 2 SPRAYS INTO BOTH NOSTRILS DAILY 16 g 0   levonorgestrel (MIRENA) 20 MCG/24HR IUD 1 each by Intrauterine route once.     Magnesium 200 MG TABS      VENTOLIN HFA 108 (90 Base) MCG/ACT inhaler INHALE 2 PUFFS INTO THE LUNGS EVERY 6 (SIX) HOURS AS NEEDED FOR WHEEZING OR SHORTNESS OF BREATH. 18 g 0  5-Hydroxytryptophan (5-HTP) 100 MG CAPS      Biotin 1 MG CAPS biotin     Cholecalciferol (VITAMIN D) 125 MCG (5000 UT) CAPS Vitamin D     methocarbamol (ROBAXIN) 500 MG tablet TAKE 1 TABLET BY MOUTH EVERY 8 HOURS AS NEEDED FOR MUSCLE SPASMS. (Patient not taking: Reported on 11/02/2020) 60 tablet 0   VITAMIN B COMPLEX-C PO vitamin B complex     XIIDRA 5 % SOLN Apply 1 drop to eye daily.  6   No facility-administered medications prior to visit.    Allergies  Allergen Reactions   Amoxicillin     ROS Review of Systems  Constitutional: Negative.   Respiratory: Negative.    Cardiovascular: Negative.   Gastrointestinal: Negative.   Depression screen PHQ 2/9 11/02/2020  Decreased Interest 0  Down, Depressed, Hopeless 1  PHQ - 2 Score 1       Objective:    Physical Exam Vitals and nursing note reviewed.  Constitutional:      General: She is not in acute distress.    Appearance: Normal appearance. She is not  ill-appearing, toxic-appearing or diaphoretic.  HENT:     Right Ear: External ear normal.     Left Ear: External ear normal.  Eyes:     General: No scleral icterus.       Right eye: No discharge.        Left eye: No discharge.     Extraocular Movements: Extraocular movements intact.     Conjunctiva/sclera: Conjunctivae normal.  Pulmonary:     Effort: Pulmonary effort is normal.  Skin:    General: Skin is warm and dry.  Neurological:     Mental Status: She is alert and oriented to person, place, and time.  Psychiatric:        Mood and Affect: Affect is tearful.    BP 110/76 (BP Location: Right Arm, Patient Position: Sitting, Cuff Size: Normal)   Pulse 90   Temp (!) 97.4 F (36.3 C) (Temporal)   Ht 5\' 3"  (1.6 m)   Wt 171 lb 3.2 oz (77.7 kg)   SpO2 97%   BMI 30.33 kg/m  Wt Readings from Last 3 Encounters:  11/02/20 171 lb 3.2 oz (77.7 kg)  02/16/18 183 lb (83 kg)  12/08/17 174 lb (78.9 kg)     Health Maintenance Due  Topic Date Due   Hepatitis C Screening  Never done   TETANUS/TDAP  Never done   COVID-19 Vaccine (3 - Pfizer risk series) 06/22/2019   INFLUENZA VACCINE  09/04/2020    There are no preventive care reminders to display for this patient.  Lab Results  Component Value Date   TSH 2.29 05/15/2017   Lab Results  Component Value Date   WBC 4.0 05/15/2017   HGB 13.8 05/15/2017   HCT 39.8 05/15/2017   MCV 91.8 05/15/2017   PLT 174.0 05/15/2017   Lab Results  Component Value Date   NA 137 05/15/2017   K 3.9 05/15/2017   CO2 25 05/15/2017   GLUCOSE 86 05/15/2017   BUN 14 05/15/2017   CREATININE 0.85 05/15/2017   BILITOT 0.4 05/15/2017   ALKPHOS 70 05/15/2017   AST 20 05/15/2017   ALT 17 05/15/2017   PROT 7.1 05/15/2017   ALBUMIN 4.4 05/15/2017   CALCIUM 9.2 05/15/2017   GFR 78.60 05/15/2017   Lab Results  Component Value Date   CHOL 217 (H) 05/15/2017   Lab Results  Component Value Date   HDL 54.40  05/15/2017   Lab Results  Component  Value Date   LDLCALC 144 (H) 05/15/2017   Lab Results  Component Value Date   TRIG 93.0 05/15/2017   Lab Results  Component Value Date   CHOLHDL 4 05/15/2017   No results found for: HGBA1C    Assessment & Plan:   Problem List Items Addressed This Visit   None Visit Diagnoses     Need for influenza vaccination    -  Primary   Relevant Orders   Flu Vaccine QUAD 6+ mos PF IM (Fluarix Quad PF)   Depression with anxiety       Relevant Medications   FLUoxetine (PROZAC) 10 MG tablet   traZODone (DESYREL) 50 MG tablet   Other Relevant Orders   Ambulatory referral to Psychology       Meds ordered this encounter  Medications   FLUoxetine (PROZAC) 10 MG tablet    Sig: Take 1 tablet (10 mg total) by mouth daily for 7 days, THEN 2 tablets (20 mg total) daily.    Dispense:  90 tablet    Refill:  3   traZODone (DESYREL) 50 MG tablet    Sig: Take 0.5-1 tablets (25-50 mg total) by mouth at bedtime as needed for sleep.    Dispense:  30 tablet    Refill:  3    Follow-up: Return in about 6 weeks (around 12/14/2020), or if symptoms worsen or fail to improve.  She is aware that her anxiety will start to be relieved after a few weeks but that it may take 4 to 8 weeks for mood elevation.  Information given on mindfulness based stress reduction.  Libby Maw, MD

## 2020-12-14 ENCOUNTER — Ambulatory Visit (INDEPENDENT_AMBULATORY_CARE_PROVIDER_SITE_OTHER): Payer: 59 | Admitting: Family Medicine

## 2020-12-14 ENCOUNTER — Other Ambulatory Visit: Payer: Self-pay

## 2020-12-14 ENCOUNTER — Encounter: Payer: Self-pay | Admitting: Family Medicine

## 2020-12-14 VITALS — BP 118/80 | HR 62 | Temp 97.3°F | Ht 63.0 in | Wt 170.6 lb

## 2020-12-14 DIAGNOSIS — Z0000 Encounter for general adult medical examination without abnormal findings: Secondary | ICD-10-CM | POA: Diagnosis not present

## 2020-12-14 DIAGNOSIS — M546 Pain in thoracic spine: Secondary | ICD-10-CM

## 2020-12-14 DIAGNOSIS — F418 Other specified anxiety disorders: Secondary | ICD-10-CM | POA: Insufficient documentation

## 2020-12-14 DIAGNOSIS — J4520 Mild intermittent asthma, uncomplicated: Secondary | ICD-10-CM | POA: Diagnosis not present

## 2020-12-14 DIAGNOSIS — S29019A Strain of muscle and tendon of unspecified wall of thorax, initial encounter: Secondary | ICD-10-CM | POA: Insufficient documentation

## 2020-12-14 MED ORDER — METHOCARBAMOL 500 MG PO TABS: 500.0000 mg | ORAL_TABLET | Freq: Three times a day (TID) | ORAL | 0 refills | Status: DC | PRN

## 2020-12-14 MED ORDER — FLUOXETINE HCL 20 MG PO CAPS
20.0000 mg | ORAL_CAPSULE | Freq: Every day | ORAL | 3 refills | Status: DC
Start: 2020-12-14 — End: 2021-09-17

## 2020-12-14 MED ORDER — ALBUTEROL SULFATE HFA 108 (90 BASE) MCG/ACT IN AERS: 1.0000 | INHALATION_SPRAY | Freq: Four times a day (QID) | RESPIRATORY_TRACT | 0 refills | Status: AC | PRN

## 2020-12-14 NOTE — Progress Notes (Signed)
Established Patient Office Visit  Subjective:  Patient ID: Sharon Carey, female    DOB: 1975/08/24  Age: 45 y.o. MRN: [id number]  CC:  Chief Complaint  Patient presents with   Annual Exam    CPE, no concerns. Patient not fasting.     HPI Sharon Carey presents for follow-up and a physical exam.  Fluoxetine has made a wonderful difference.  Mood is elevated.  But more relaxed abdomen work remains stressful.  Her husband has noticed a difference.  She is sleeping well.  Using trazodone as needed.  Here for health check health check as well.  Female care is through GYN.  Is feeling motivated to restart her exercise program.  Using Robaxin on rare occasions for muscle spasms.  Would like to keep a Ventolin inhaler around for occasional reactive airway disease.  Past Medical History:  Diagnosis Date   ADHD    Depression    Dry eye    bilateral   Kyphosis    Reactive airway disease    Scoliosis    confirmed on MRI    Past Surgical History:  Procedure Laterality Date   CESAREAN SECTION      2006 and 2011    Family History  Problem Relation Age of Onset   Heart attack Mother    Heart attack Father     Social History   Socioeconomic History   Marital status: Married    Spouse name: Not on file   Number of children: 3   Years of education: Not on file   Highest education level: Professional school degree (e.g., MD, DDS, DVM, JD)  Occupational History   Not on file  Tobacco Use   Smoking status: Never   Smokeless tobacco: Never  Vaping Use   Vaping Use: Never used  Substance and Sexual Activity   Alcohol use: Yes    Alcohol/week: 4.0 - 5.0 standard drinks    Types: 4 - 5 Standard drinks or equivalent per week    Comment: 2-3 weekly   Drug use: Never   Sexual activity: Yes    Birth control/protection: I.U.D.  Other Topics Concern   Not on file  Social History Narrative   Lives at home with husband and 3 boys   Right handed   Caffeine: 20 oz diet soda x 3  daily   Social Determinants of Health   Financial Resource Strain: Not on file  Food Insecurity: Not on file  Transportation Needs: Not on file  Physical Activity: Not on file  Stress: Not on file  Social Connections: Not on file  Intimate Partner Violence: Not on file    Outpatient Medications Prior to Visit  Medication Sig Dispense Refill   acetaZOLAMIDE (DIAMOX) 250 MG tablet Take 1 tablet (250 mg total) by mouth 3 (three) times daily as needed. 90 tablet 4   bimatoprost (LATISSE) 0.03 % ophthalmic solution Place 1 drop into both eyes at bedtime.     Biotin 1 MG CAPS biotin     Cholecalciferol (VITAMIN D) 125 MCG (5000 UT) CAPS Vitamin D     fluticasone (FLONASE) 50 MCG/ACT nasal spray PLACE 2 SPRAYS INTO BOTH NOSTRILS DAILY 16 g 0   levonorgestrel (MIRENA) 20 MCG/24HR IUD 1 each by Intrauterine route once.     traZODone (DESYREL) 50 MG tablet Take 0.5-1 tablets (25-50 mg total) by mouth at bedtime as needed for sleep. 30 tablet 3   VENTOLIN HFA 108 (90 Base) MCG/ACT inhaler INHALE 2 PUFFS  INTO THE LUNGS EVERY 6 (SIX) HOURS AS NEEDED FOR WHEEZING OR SHORTNESS OF BREATH. 18 g 0   VITAMIN B COMPLEX-C PO vitamin B complex     FLUoxetine (PROZAC) 10 MG tablet Take 1 tablet (10 mg total) by mouth daily for 7 days, THEN 2 tablets (20 mg total) daily. 90 tablet 3   Magnesium 200 MG TABS  (Patient not taking: Reported on 12/14/2020)     methocarbamol (ROBAXIN) 500 MG tablet TAKE 1 TABLET BY MOUTH EVERY 8 HOURS AS NEEDED FOR MUSCLE SPASMS. (Patient not taking: No sig reported) 60 tablet 0   No facility-administered medications prior to visit.    Allergies  Allergen Reactions   Amoxicillin     ROS Review of Systems  Constitutional:  Negative for chills, diaphoresis, fatigue, fever and unexpected weight change.  HENT: Negative.    Eyes:  Negative for photophobia and visual disturbance.  Respiratory: Negative.    Cardiovascular: Negative.   Gastrointestinal: Negative.   Endocrine:  Negative for polyphagia and polyuria.  Genitourinary: Negative.   Musculoskeletal:  Negative for gait problem and joint swelling.  Skin:  Negative for pallor and rash.  Neurological:  Negative for speech difficulty and weakness.  Hematological:  Does not bruise/bleed easily.  Psychiatric/Behavioral: Negative.       Objective:    Physical Exam Vitals and nursing note reviewed.  Constitutional:      General: She is not in acute distress.    Appearance: Normal appearance. She is not ill-appearing, toxic-appearing or diaphoretic.  HENT:     Head: Normocephalic and atraumatic.     Right Ear: Tympanic membrane, ear canal and external ear normal.     Left Ear: Tympanic membrane, ear canal and external ear normal.     Mouth/Throat:     Mouth: Mucous membranes are moist.     Pharynx: Oropharynx is clear. No oropharyngeal exudate or posterior oropharyngeal erythema.  Eyes:     General: No scleral icterus.       Right eye: No discharge.        Left eye: No discharge.     Extraocular Movements: Extraocular movements intact.     Conjunctiva/sclera: Conjunctivae normal.     Pupils: Pupils are equal, round, and reactive to light.  Neck:     Vascular: No carotid bruit.  Cardiovascular:     Rate and Rhythm: Normal rate and regular rhythm.  Pulmonary:     Effort: Pulmonary effort is normal.     Breath sounds: Normal breath sounds.  Abdominal:     General: Bowel sounds are normal.  Musculoskeletal:     Cervical back: No rigidity or tenderness.     Right lower leg: No edema.     Left lower leg: No edema.  Lymphadenopathy:     Cervical: No cervical adenopathy.  Skin:    General: Skin is warm and dry.  Neurological:     Mental Status: She is alert and oriented to person, place, and time.  Psychiatric:        Mood and Affect: Mood normal.        Behavior: Behavior normal.    BP 118/80 (BP Location: Left Arm, Patient Position: Sitting, Cuff Size: Normal)   Pulse 62   Temp (!) 97.3 F  (36.3 C) (Temporal)   Ht 5\' 3"  (1.6 m)   Wt 170 lb 9.6 oz (77.4 kg)   SpO2 97%   BMI 30.22 kg/m  Wt Readings from Last 3 Encounters:  12/14/20 170  lb 9.6 oz (77.4 kg)  11/02/20 171 lb 3.2 oz (77.7 kg)  02/16/18 183 lb (83 kg)     Health Maintenance Due  Topic Date Due   Pneumococcal Vaccine 40-29 Years old (1 - PCV) Never done   Hepatitis C Screening  Never done   TETANUS/TDAP  Never done    There are no preventive care reminders to display for this patient.  Lab Results  Component Value Date   TSH 2.29 05/15/2017   Lab Results  Component Value Date   WBC 4.0 05/15/2017   HGB 13.8 05/15/2017   HCT 39.8 05/15/2017   MCV 91.8 05/15/2017   PLT 174.0 05/15/2017   Lab Results  Component Value Date   NA 137 05/15/2017   K 3.9 05/15/2017   CO2 25 05/15/2017   GLUCOSE 86 05/15/2017   BUN 14 05/15/2017   CREATININE 0.85 05/15/2017   BILITOT 0.4 05/15/2017   ALKPHOS 70 05/15/2017   AST 20 05/15/2017   ALT 17 05/15/2017   PROT 7.1 05/15/2017   ALBUMIN 4.4 05/15/2017   CALCIUM 9.2 05/15/2017   GFR 78.60 05/15/2017   Lab Results  Component Value Date   CHOL 217 (H) 05/15/2017   Lab Results  Component Value Date   HDL 54.40 05/15/2017   Lab Results  Component Value Date   LDLCALC 144 (H) 05/15/2017   Lab Results  Component Value Date   TRIG 93.0 05/15/2017   Lab Results  Component Value Date   CHOLHDL 4 05/15/2017   No results found for: HGBA1C    Assessment & Plan:   Problem List Items Addressed This Visit   None Visit Diagnoses     Healthcare maintenance    -  Primary   Relevant Orders   CBC   Comprehensive metabolic panel   Lipid panel   Urinalysis, Routine w reflex microscopic   Depression with anxiety       Relevant Medications   FLUoxetine (PROZAC) 20 MG capsule       Meds ordered this encounter  Medications   FLUoxetine (PROZAC) 20 MG capsule    Sig: Take 1 capsule (20 mg total) by mouth daily.    Dispense:  90 capsule     Refill:  3    Follow-up: Return in about 3 months (around 03/16/2021).  Continue Prozac and trazodone as needed.  It is working well.  Information was given on mindfulness based stress reduction.  Will return fasting for above ordered blood work.  She is planning on reestablishing a workout routine.  Information was given on health maintenance and disease prevention.  Continue female checks with GYN.  Libby Maw, MD

## 2020-12-22 ENCOUNTER — Other Ambulatory Visit (INDEPENDENT_AMBULATORY_CARE_PROVIDER_SITE_OTHER): Payer: 59

## 2020-12-22 ENCOUNTER — Ambulatory Visit: Payer: 59 | Admitting: Family Medicine

## 2020-12-22 ENCOUNTER — Other Ambulatory Visit: Payer: Self-pay

## 2020-12-22 DIAGNOSIS — Z0000 Encounter for general adult medical examination without abnormal findings: Secondary | ICD-10-CM | POA: Diagnosis not present

## 2020-12-22 LAB — CBC
HCT: 40.7 % (ref 36.0–46.0)
Hemoglobin: 14.1 g/dL (ref 12.0–15.0)
MCHC: 34.7 g/dL (ref 30.0–36.0)
MCV: 93.6 fl (ref 78.0–100.0)
Platelets: 162 10*3/uL (ref 150.0–400.0)
RBC: 4.34 Mil/uL (ref 3.87–5.11)
RDW: 12.4 % (ref 11.5–15.5)
WBC: 3.1 10*3/uL — ABNORMAL LOW (ref 4.0–10.5)

## 2020-12-22 LAB — URINALYSIS, ROUTINE W REFLEX MICROSCOPIC
Bilirubin Urine: NEGATIVE
Ketones, ur: NEGATIVE
Nitrite: NEGATIVE
Specific Gravity, Urine: <=1.005 — AB (ref 1.000–1.030)
Total Protein, Urine: NEGATIVE
Urine Glucose: NEGATIVE
Urobilinogen, UA: 0.2 (ref 0.0–1.0)
pH: 6 (ref 5.0–8.0)

## 2020-12-22 LAB — COMPREHENSIVE METABOLIC PANEL
ALT: 10 U/L (ref 0–35)
AST: 13 U/L (ref 0–37)
Albumin: 4.5 g/dL (ref 3.5–5.2)
Alkaline Phosphatase: 59 U/L (ref 39–117)
BUN: 14 mg/dL (ref 6–23)
CO2: 27 mEq/L (ref 19–32)
Calcium: 9.1 mg/dL (ref 8.4–10.5)
Chloride: 102 mEq/L (ref 96–112)
Creatinine, Ser: 0.89 mg/dL (ref 0.40–1.20)
GFR: 79.11 mL/min (ref >60.00)
Glucose, Bld: 102 mg/dL — ABNORMAL HIGH (ref 70–99)
Potassium: 4.3 mEq/L (ref 3.5–5.1)
Sodium: 136 mEq/L (ref 135–145)
Total Bilirubin: 0.4 mg/dL (ref 0.2–1.2)
Total Protein: 6.5 g/dL (ref 6.0–8.3)

## 2020-12-22 LAB — LIPID PANEL
Cholesterol: 223 mg/dL — ABNORMAL HIGH (ref 0–200)
HDL: 75.1 mg/dL (ref >39.00)
LDL Cholesterol: 136 mg/dL — ABNORMAL HIGH (ref 0–99)
NonHDL: 148.39
Total CHOL/HDL Ratio: 3
Triglycerides: 64 mg/dL (ref 0.0–149.0)
VLDL: 12.8 mg/dL (ref 0.0–40.0)

## 2020-12-22 NOTE — Progress Notes (Signed)
Pt is here for lab work.Blood was drawn from the left median cubital vein. Pt tolerated draw well. Pt instructed to leave bandage on for 3-5 mins.

## 2021-03-16 ENCOUNTER — Ambulatory Visit: Payer: 59 | Admitting: Family Medicine

## 2021-03-19 ENCOUNTER — Ambulatory Visit: Payer: 59 | Admitting: Family Medicine

## 2021-03-19 ENCOUNTER — Other Ambulatory Visit: Payer: Self-pay

## 2021-03-19 ENCOUNTER — Encounter: Payer: Self-pay | Admitting: Family Medicine

## 2021-03-19 VITALS — BP 124/78 | HR 70 | Temp 97.5°F | Resp 18 | Ht 64.0 in | Wt 166.4 lb

## 2021-03-19 DIAGNOSIS — E162 Hypoglycemia, unspecified: Secondary | ICD-10-CM

## 2021-03-19 DIAGNOSIS — F418 Other specified anxiety disorders: Secondary | ICD-10-CM

## 2021-03-19 DIAGNOSIS — S161XXD Strain of muscle, fascia and tendon at neck level, subsequent encounter: Secondary | ICD-10-CM | POA: Diagnosis not present

## 2021-03-19 DIAGNOSIS — S161XXA Strain of muscle, fascia and tendon at neck level, initial encounter: Secondary | ICD-10-CM | POA: Insufficient documentation

## 2021-03-19 NOTE — Progress Notes (Signed)
Established Patient Office Visit  Subjective:  Patient ID: Sharon Carey, female    DOB: 1975/04/21  Age: 46 y.o. MRN: [id number]  CC:  Chief Complaint  Patient presents with   Follow-up    3 mo follow up. Pt states medications have helped since last seen. Pt would like to discuss hypoglycemia     HPI Sharon Carey presents for follow-up of depression with anxiety and insomnia.  Prozac helps.  Trazodone as needed has been helpful for insomnia.  Work continues to be stressful.  Workload has increased.  Things are okay at home.  She has been having some neck pain on the right side.  Has asked for a stand-up desk.  Using Robaxin as needed.  Has experienced bouts of hypoglycemia.  No history of diabetes or gestational diabetes.  Has experienced fatigue.  Past Medical History:  Diagnosis Date   ADHD    Depression    Dry eye    bilateral   Kyphosis    Reactive airway disease    Scoliosis    confirmed on MRI    Past Surgical History:  Procedure Laterality Date   CESAREAN SECTION      2006 and 2011    Family History  Problem Relation Age of Onset   Heart attack Mother    Heart attack Father     Social History   Socioeconomic History   Marital status: Married    Spouse name: Not on file   Number of children: 3   Years of education: Not on file   Highest education level: Professional school degree (e.g., MD, DDS, DVM, JD)  Occupational History   Not on file  Tobacco Use   Smoking status: Never   Smokeless tobacco: Never  Vaping Use   Vaping Use: Never used  Substance and Sexual Activity   Alcohol use: Yes    Alcohol/week: 4.0 - 5.0 standard drinks    Types: 4 - 5 Standard drinks or equivalent per week    Comment: 2-3 weekly   Drug use: Never   Sexual activity: Yes    Birth control/protection: I.U.D.  Other Topics Concern   Not on file  Social History Narrative   Lives at home with husband and 3 boys   Right handed   Caffeine: 20 oz diet soda x 3 daily    Social Determinants of Health   Financial Resource Strain: Not on file  Food Insecurity: Not on file  Transportation Needs: Not on file  Physical Activity: Not on file  Stress: Not on file  Social Connections: Not on file  Intimate Partner Violence: Not on file    Outpatient Medications Prior to Visit  Medication Sig Dispense Refill   acetaZOLAMIDE (DIAMOX) 250 MG tablet Take 1 tablet (250 mg total) by mouth 3 (three) times daily as needed. 90 tablet 4   albuterol (VENTOLIN HFA) 108 (90 Base) MCG/ACT inhaler Inhale 1-2 puffs into the lungs every 6 (six) hours as needed for wheezing or shortness of breath. 18 g 0   bimatoprost (LATISSE) 0.03 % ophthalmic solution Place 1 drop into both eyes at bedtime.     Biotin 1 MG CAPS biotin     Cholecalciferol (VITAMIN D) 125 MCG (5000 UT) CAPS Vitamin D     FLUoxetine (PROZAC) 20 MG capsule Take 1 capsule (20 mg total) by mouth daily. 90 capsule 3   fluticasone (FLONASE) 50 MCG/ACT nasal spray PLACE 2 SPRAYS INTO BOTH NOSTRILS DAILY 16 g 0  levonorgestrel (MIRENA) 20 MCG/24HR IUD 1 each by Intrauterine route once.     methocarbamol (ROBAXIN) 500 MG tablet Take 1 tablet (500 mg total) by mouth every 8 (eight) hours as needed for muscle spasms. 60 tablet 0   traZODone (DESYREL) 50 MG tablet Take 0.5-1 tablets (25-50 mg total) by mouth at bedtime as needed for sleep. 30 tablet 3   VITAMIN B COMPLEX-C PO vitamin B complex     No facility-administered medications prior to visit.    Allergies  Allergen Reactions   Amoxicillin     ROS Review of Systems  Constitutional:  Positive for fatigue. Negative for diaphoresis, fever and unexpected weight change.  HENT: Negative.    Eyes:  Negative for photophobia and visual disturbance.  Respiratory: Negative.    Cardiovascular: Negative.   Gastrointestinal: Negative.  Negative for constipation, nausea and vomiting.  Genitourinary: Negative.   Neurological:  Negative for speech difficulty,  weakness and light-headedness.  Psychiatric/Behavioral: Negative.       Objective:    Physical Exam Vitals and nursing note reviewed.  Constitutional:      General: She is not in acute distress.    Appearance: Normal appearance. She is not ill-appearing, toxic-appearing or diaphoretic.  HENT:     Head: Normocephalic and atraumatic.     Right Ear: External ear normal.     Left Ear: External ear normal.     Mouth/Throat:     Pharynx: No posterior oropharyngeal erythema.  Eyes:     General: No scleral icterus.       Right eye: No discharge.        Left eye: No discharge.     Extraocular Movements: Extraocular movements intact.     Conjunctiva/sclera: Conjunctivae normal.     Pupils: Pupils are equal, round, and reactive to light.  Cardiovascular:     Rate and Rhythm: Regular rhythm.  Pulmonary:     Effort: Pulmonary effort is normal.     Breath sounds: Normal breath sounds.  Abdominal:     General: Abdomen is flat. Bowel sounds are normal. There is no distension.     Palpations: Abdomen is soft. There is no mass.     Tenderness: There is no abdominal tenderness. There is no guarding or rebound.     Hernia: No hernia is present.  Musculoskeletal:     Right shoulder: Tenderness (mild ttp of sub acromial bursa) present. No bony tenderness. Normal range of motion.     Left shoulder: No tenderness or bony tenderness. Normal range of motion.     Cervical back: No rigidity, tenderness or bony tenderness. No pain with movement. Normal range of motion.       Back:  Lymphadenopathy:     Cervical: No cervical adenopathy.  Skin:    General: Skin is warm and dry.  Neurological:     Mental Status: She is alert and oriented to person, place, and time.  Psychiatric:        Mood and Affect: Mood normal.        Behavior: Behavior normal.    BP 124/78 (BP Location: Right Arm, Patient Position: Sitting, Cuff Size: Normal)    Pulse 70    Temp (!) 97.5 F (36.4 C) (Temporal)    Resp 18     Ht 5\' 4"  (1.626 m)    Wt 166 lb 6.4 oz (75.5 kg)    SpO2 99%    BMI 28.56 kg/m  Wt Readings from Last 3 Encounters:  03/19/21  166 lb 6.4 oz (75.5 kg)  12/14/20 170 lb 9.6 oz (77.4 kg)  11/02/20 171 lb 3.2 oz (77.7 kg)     Health Maintenance Due  Topic Date Due   Hepatitis C Screening  Never done   TETANUS/TDAP  Never done    There are no preventive care reminders to display for this patient.  Lab Results  Component Value Date   TSH 2.29 05/15/2017   Lab Results  Component Value Date   WBC 3.1 (L) 12/22/2020   HGB 14.1 12/22/2020   HCT 40.7 12/22/2020   MCV 93.6 12/22/2020   PLT 162.0 12/22/2020   Lab Results  Component Value Date   NA 136 12/22/2020   K 4.3 12/22/2020   CO2 27 12/22/2020   GLUCOSE 102 (H) 12/22/2020   BUN 14 12/22/2020   CREATININE 0.89 12/22/2020   BILITOT 0.4 12/22/2020   ALKPHOS 59 12/22/2020   AST 13 12/22/2020   ALT 10 12/22/2020   PROT 6.5 12/22/2020   ALBUMIN 4.5 12/22/2020   CALCIUM 9.1 12/22/2020   GFR 79.11 12/22/2020   Lab Results  Component Value Date   CHOL 223 (H) 12/22/2020   Lab Results  Component Value Date   HDL 75.10 12/22/2020   Lab Results  Component Value Date   LDLCALC 136 (H) 12/22/2020   Lab Results  Component Value Date   TRIG 64.0 12/22/2020   Lab Results  Component Value Date   CHOLHDL 3 12/22/2020   No results found for: HGBA1C    Assessment & Plan:   Problem List Items Addressed This Visit       Musculoskeletal and Integument   Cervical strain     Other   Depression with anxiety - Primary   Other Visit Diagnoses     Hypoglycemia       Relevant Orders   Hemoglobin A1c   Cortisol       No orders of the defined types were placed in this encounter.   Follow-up: Return in about 6 months (around 09/16/2021).   Continue daily Prozac and trazodone as needed.  Use Robaxin as needed for cervical strain.  Consider physical therapy.  We will consider augmentation with Wellbutrin and/or  talking therapy.  Patient has no time for that at this point.  Return in morning for cortisol and hemoglobin A1c levels. Libby Maw, MD

## 2021-03-23 ENCOUNTER — Other Ambulatory Visit: Payer: Self-pay

## 2021-03-23 ENCOUNTER — Other Ambulatory Visit (INDEPENDENT_AMBULATORY_CARE_PROVIDER_SITE_OTHER): Payer: 59

## 2021-03-23 DIAGNOSIS — E162 Hypoglycemia, unspecified: Secondary | ICD-10-CM

## 2021-03-23 LAB — CORTISOL: Cortisol, Plasma: 11.9 ug/dL

## 2021-03-23 LAB — HEMOGLOBIN A1C: Hgb A1c MFr Bld: 5.3 % (ref 4.6–6.5)

## 2021-03-26 ENCOUNTER — Other Ambulatory Visit: Payer: Self-pay | Admitting: Family Medicine

## 2021-03-26 DIAGNOSIS — F418 Other specified anxiety disorders: Secondary | ICD-10-CM

## 2021-08-23 ENCOUNTER — Other Ambulatory Visit: Payer: Self-pay | Admitting: Family Medicine

## 2021-08-23 DIAGNOSIS — F418 Other specified anxiety disorders: Secondary | ICD-10-CM

## 2021-09-17 ENCOUNTER — Ambulatory Visit: Payer: 59 | Admitting: Family Medicine

## 2021-09-17 ENCOUNTER — Encounter: Payer: Self-pay | Admitting: Family Medicine

## 2021-09-17 VITALS — BP 114/68 | HR 68 | Temp 97.8°F | Ht 64.0 in | Wt 166.0 lb

## 2021-09-17 DIAGNOSIS — Z23 Encounter for immunization: Secondary | ICD-10-CM | POA: Insufficient documentation

## 2021-09-17 DIAGNOSIS — R1084 Generalized abdominal pain: Secondary | ICD-10-CM | POA: Diagnosis not present

## 2021-09-17 DIAGNOSIS — F418 Other specified anxiety disorders: Secondary | ICD-10-CM | POA: Diagnosis not present

## 2021-09-17 DIAGNOSIS — E739 Lactose intolerance, unspecified: Secondary | ICD-10-CM | POA: Diagnosis not present

## 2021-09-17 MED ORDER — FLUOXETINE HCL 20 MG PO CAPS: 20.0000 mg | ORAL_CAPSULE | Freq: Every day | ORAL | 3 refills | Status: DC

## 2021-09-17 MED ORDER — TRAZODONE HCL 50 MG PO TABS: 50.0000 mg | ORAL_TABLET | Freq: Every evening | ORAL | 3 refills | Status: DC | PRN

## 2021-09-17 NOTE — Progress Notes (Unsigned)
Established Patient Office Visit  Subjective   Patient ID: Sharon Carey, female    DOB: 1975-06-16  Age: 46 y.o. MRN: [id number]  Chief Complaint  Patient presents with   Follow-up    6 month follow up, stomach issues little problems when eating certain foods.     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  Stress at work persists.  Things are okay at home.  Twins are 33 and her son is a rising 6 grader.  Prozac is helping.  Trazodone helps with sleep.  Uses a smaller dose of trazodone during the week as a result of lingering somnolence.  Reports ongoing pain associated with bloating.  There can be constipation alternating with loose stooling.  Seem to be increasingly related to dairy product consumption but not always.  Denies weight loss or hematochezia.  {History (Optional):23778}  Review of Systems  Constitutional:  Negative for chills, diaphoresis, malaise/fatigue and weight loss.  HENT: Negative.    Eyes: Negative.  Negative for blurred vision and double vision.  Respiratory: Negative.    Cardiovascular:  Negative for chest pain.  Gastrointestinal:  Positive for abdominal pain, constipation and diarrhea. Negative for blood in stool and melena.  Genitourinary: Negative.   Musculoskeletal:  Negative for falls and myalgias.  Neurological:  Negative for speech change, loss of consciousness and weakness.      09/17/2021    3:47 PM 03/19/2021    4:24 PM 12/14/2020    3:53 PM  Depression screen PHQ 2/9  Decreased Interest 0 1 1  Down, Depressed, Hopeless 1 1 0  PHQ - 2 Score '1 2 1  '$ Altered sleeping  0 0  Tired, decreased energy  0 1  Change in appetite  3 0  Feeling bad or failure about yourself   0 0  Trouble concentrating  1 1  Moving slowly or fidgety/restless  1 0  Suicidal thoughts  0 0  PHQ-9 Score  7 3  Difficult doing work/chores  Not difficult at all Not difficult at all       Objective:     BP 114/68 (BP Location: Right  Arm, Patient Position: Sitting, Cuff Size: Normal)   Pulse 68   Temp 97.8 F (36.6 C) (Temporal)   Ht '5\' 4"'$  (1.626 m)   Wt 166 lb (75.3 kg)   SpO2 97%   BMI 28.49 kg/m  Wt Readings from Last 3 Encounters:  09/17/21 166 lb (75.3 kg)  03/19/21 166 lb 6.4 oz (75.5 kg)  12/14/20 170 lb 9.6 oz (77.4 kg)      Physical Exam Constitutional:      General: She is not in acute distress.    Appearance: Normal appearance. She is not ill-appearing, toxic-appearing or diaphoretic.  HENT:     Head: Normocephalic and atraumatic.     Right Ear: External ear normal.     Left Ear: External ear normal.     Mouth/Throat:     Mouth: Mucous membranes are moist.     Pharynx: Oropharynx is clear. No oropharyngeal exudate or posterior oropharyngeal erythema.  Eyes:     General: No scleral icterus.       Right eye: No discharge.        Left eye: No discharge.     Extraocular Movements: Extraocular movements intact.     Conjunctiva/sclera: Conjunctivae normal.  Pulmonary:     Effort: Pulmonary effort is normal. No respiratory distress.  Abdominal:     Tenderness: There  is no guarding.  Skin:    General: Skin is warm and dry.  Neurological:     Mental Status: She is alert and oriented to person, place, and time.  Psychiatric:        Mood and Affect: Mood normal.        Behavior: Behavior normal.      No results found for any visits on 09/17/21.  {Labs (Optional):23779}  The 10-year ASCVD risk score (Arnett DK, et al., 2019) is: 0.4%    Assessment & Plan:   Problem List Items Addressed This Visit       Other   Depression with anxiety   Relevant Medications   traZODone (DESYREL) 50 MG tablet   FLUoxetine (PROZAC) 20 MG capsule   Lactose intolerance   Generalized abdominal pain   Relevant Orders   Amylase   CBC   Comprehensive metabolic panel   Gliadin antibodies, serum   Tissue transglutaminase, IgA   Reticulin Antibody, IgA w reflex titer   Need for Tdap vaccination -  Primary   Relevant Orders   Tdap vaccine greater than or equal to 7yo IM (Completed)    Return in about 6 months (around 03/20/2022), or if symptoms worsen or fail to improve.  Discussed lactose intolerance and information was given about this disorder.  Information was also given about the FODMAP diet.  Libby Maw, MD

## 2021-09-18 LAB — COMPREHENSIVE METABOLIC PANEL
ALT: 10 U/L (ref 0–35)
AST: 15 U/L (ref 0–37)
Albumin: 4.7 g/dL (ref 3.5–5.2)
Alkaline Phosphatase: 61 U/L (ref 39–117)
BUN: 10 mg/dL (ref 6–23)
CO2: 29 mEq/L (ref 19–32)
Calcium: 9.4 mg/dL (ref 8.4–10.5)
Chloride: 103 mEq/L (ref 96–112)
Creatinine, Ser: 0.92 mg/dL (ref 0.40–1.20)
GFR: 75.63 mL/min (ref >60.00)
Glucose, Bld: 88 mg/dL (ref 70–99)
Potassium: 3.6 mEq/L (ref 3.5–5.1)
Sodium: 135 mEq/L (ref 135–145)
Total Bilirubin: 0.4 mg/dL (ref 0.2–1.2)
Total Protein: 7.1 g/dL (ref 6.0–8.3)

## 2021-09-18 LAB — CBC
HCT: 40.3 % (ref 36.0–46.0)
Hemoglobin: 13.9 g/dL (ref 12.0–15.0)
MCHC: 34.5 g/dL (ref 30.0–36.0)
MCV: 95.3 fl (ref 78.0–100.0)
Platelets: 192 10*3/uL (ref 150.0–400.0)
RBC: 4.23 Mil/uL (ref 3.87–5.11)
RDW: 12.8 % (ref 11.5–15.5)
WBC: 3.9 10*3/uL — ABNORMAL LOW (ref 4.0–10.5)

## 2021-09-18 LAB — GLIADIN ANTIBODIES, SERUM
Gliadin IgA: <1 U/mL
Gliadin IgG: <1 U/mL

## 2021-09-18 LAB — AMYLASE: Amylase: 29 U/L (ref 27–131)

## 2021-09-18 LAB — TISSUE TRANSGLUTAMINASE, IGA: (tTG) Ab, IgA: <1 U/mL

## 2021-09-20 ENCOUNTER — Encounter: Payer: Self-pay | Admitting: Family Medicine

## 2021-09-20 LAB — RETICULIN ANTIBODIES, IGA W TITER: Reticulin Ab, IgA: NEGATIVE titer (ref <2.5)

## 2022-01-10 ENCOUNTER — Other Ambulatory Visit (HOSPITAL_COMMUNITY): Payer: Self-pay

## 2022-03-22 ENCOUNTER — Encounter: Payer: Self-pay | Admitting: Family Medicine

## 2022-03-22 ENCOUNTER — Ambulatory Visit: Payer: 59 | Admitting: Family Medicine

## 2022-03-22 VITALS — BP 114/72 | HR 67 | Temp 98.0°F | Ht 64.0 in | Wt 169.2 lb

## 2022-03-22 DIAGNOSIS — M546 Pain in thoracic spine: Secondary | ICD-10-CM | POA: Diagnosis not present

## 2022-03-22 DIAGNOSIS — Z23 Encounter for immunization: Secondary | ICD-10-CM

## 2022-03-22 DIAGNOSIS — F418 Other specified anxiety disorders: Secondary | ICD-10-CM | POA: Diagnosis not present

## 2022-03-22 DIAGNOSIS — S161XXD Strain of muscle, fascia and tendon at neck level, subsequent encounter: Secondary | ICD-10-CM

## 2022-03-22 DIAGNOSIS — R079 Chest pain, unspecified: Secondary | ICD-10-CM

## 2022-03-22 MED ORDER — TRAZODONE HCL 50 MG PO TABS: 50.0000 mg | ORAL_TABLET | Freq: Every evening | ORAL | 3 refills | Status: DC | PRN

## 2022-03-22 MED ORDER — METHOCARBAMOL 500 MG PO TABS: 500.0000 mg | ORAL_TABLET | Freq: Three times a day (TID) | ORAL | 0 refills | Status: AC | PRN

## 2022-03-22 MED ORDER — FLUOXETINE HCL 20 MG PO CAPS: 20.0000 mg | ORAL_CAPSULE | Freq: Every day | ORAL | 3 refills | Status: DC

## 2022-03-22 NOTE — Progress Notes (Signed)
Established Patient Office Visit   Subjective:  Patient ID: Sharon Carey, female    DOB: 07-27-1975  Age: 47 y.o. MRN: [id number]  Chief Complaint  Patient presents with   Medical Management of Chronic Issues    6 month follow up, no concerns.     HPI Encounter Diagnoses  Name Primary?   Chest pain, unspecified type Yes   Depression with anxiety    Need for immunization against influenza    Strain of neck muscle, subsequent encounter    Thoracic back pain, unspecified back pain laterality, unspecified chronicity    Doing okay.  Work seems to be a little more manageable.  Prozac's continues to be beneficial.  Trazodone continues to help a lot with sleep.  Occasional neck pain with long shifts.  She is working on her neck posture.  She uses an upright desk.  There was a stressful week at work just before the holidays where she felt some fleeting chest pain.  It was not associated with nausea, diaphoresis or shortness of breath.  It totally resolved and has not returned.  Her blood pressure is normal.  She has never smoked.  She has a favorable lipid profile.  However both of her parents had heart disease at younger ages.  She has occasional indigestion that she takes Pepcid to relieve.   Review of Systems  Constitutional: Negative.  Negative for diaphoresis.  HENT: Negative.    Eyes:  Negative for blurred vision, discharge and redness.  Respiratory: Negative.  Negative for shortness of breath.   Cardiovascular:  Positive for chest pain. Negative for palpitations.  Gastrointestinal:  Negative for abdominal pain, nausea and vomiting.  Genitourinary: Negative.   Musculoskeletal: Negative.  Negative for myalgias.  Skin:  Negative for rash.  Neurological:  Negative for tingling, loss of consciousness and weakness.  Endo/Heme/Allergies:  Negative for polydipsia.     Current Outpatient Medications:    albuterol (VENTOLIN HFA) 108 (90 Base) MCG/ACT inhaler, Inhale 1-2 puffs into the  lungs every 6 (six) hours as needed for wheezing or shortness of breath., Disp: 18 g, Rfl: 0   bimatoprost (LATISSE) 0.03 % ophthalmic solution, Place 1 drop into both eyes at bedtime., Disp: , Rfl:    Biotin 1 MG CAPS, biotin, Disp: , Rfl:    Cholecalciferol (VITAMIN D) 125 MCG (5000 UT) CAPS, Vitamin D, Disp: , Rfl:    fluticasone (FLONASE) 50 MCG/ACT nasal spray, PLACE 2 SPRAYS INTO BOTH NOSTRILS DAILY, Disp: 16 g, Rfl: 0   levonorgestrel (MIRENA) 20 MCG/24HR IUD, 1 each by Intrauterine route once., Disp: , Rfl:    acetaZOLAMIDE (DIAMOX) 250 MG tablet, Take 1 tablet (250 mg total) by mouth 3 (three) times daily as needed. (Patient not taking: Reported on 09/17/2021), Disp: 90 tablet, Rfl: 4   FLUoxetine (PROZAC) 20 MG capsule, Take 1 capsule (20 mg total) by mouth daily., Disp: 90 capsule, Rfl: 3   methocarbamol (ROBAXIN) 500 MG tablet, Take 1 tablet (500 mg total) by mouth every 8 (eight) hours as needed for muscle spasms., Disp: 60 tablet, Rfl: 0   traZODone (DESYREL) 50 MG tablet, Take 1 tablet (50 mg total) by mouth at bedtime as needed for sleep., Disp: 30 tablet, Rfl: 3   Objective:     BP 114/72   Pulse 67   Temp 98 F (36.7 C)   Ht 5' 4"$  (1.626 m)   Wt 169 lb 3.2 oz (76.7 kg)   SpO2 95%   BMI 29.04 kg/m  Physical Exam   No results found for any visits on 03/22/22.    The 10-year ASCVD risk score (Arnett DK, et al., 2019) is: 0.5%    Assessment & Plan:   Chest pain, unspecified type  Depression with anxiety -     traZODone HCl; Take 1 tablet (50 mg total) by mouth at bedtime as needed for sleep.  Dispense: 30 tablet; Refill: 3 -     FLUoxetine HCl; Take 1 capsule (20 mg total) by mouth daily.  Dispense: 90 capsule; Refill: 3  Need for immunization against influenza -     Flu Vaccine QUAD 49moIM (Fluarix, Fluzone & Alfiuria Quad PF)  Strain of neck muscle, subsequent encounter  Thoracic back pain, unspecified back pain laterality, unspecified chronicity -      Methocarbamol; Take 1 tablet (500 mg total) by mouth every 8 (eight) hours as needed for muscle spasms.  Dispense: 60 tablet; Refill: 0    Return Return in 5 to 6 months for fasting blood work..  Continue fluoxetine and trazodone Believe that chest pain was more likely stress-induced.  We discussed having an exercise stress test.  She would like to hold off for now and I agree.  She will let me know if there are any further episodes.  WLibby Maw MD

## 2022-04-08 ENCOUNTER — Encounter: Payer: Self-pay | Admitting: Family Medicine

## 2022-09-04 ENCOUNTER — Encounter (INDEPENDENT_AMBULATORY_CARE_PROVIDER_SITE_OTHER): Payer: Self-pay

## 2022-09-20 ENCOUNTER — Encounter: Payer: Self-pay | Admitting: Family Medicine

## 2022-09-20 ENCOUNTER — Ambulatory Visit: Payer: 59 | Admitting: Family Medicine

## 2022-09-20 VITALS — BP 124/70 | HR 80 | Temp 97.9°F | Ht 64.0 in | Wt 160.0 lb

## 2022-09-20 DIAGNOSIS — E7800 Pure hypercholesterolemia, unspecified: Secondary | ICD-10-CM | POA: Diagnosis not present

## 2022-09-20 DIAGNOSIS — Z0000 Encounter for general adult medical examination without abnormal findings: Secondary | ICD-10-CM

## 2022-09-20 DIAGNOSIS — F418 Other specified anxiety disorders: Secondary | ICD-10-CM

## 2022-09-20 DIAGNOSIS — Z131 Encounter for screening for diabetes mellitus: Secondary | ICD-10-CM

## 2022-09-20 LAB — LIPID PANEL
Cholesterol: 239 mg/dL — ABNORMAL HIGH (ref 0–200)
HDL: 68.1 mg/dL (ref >39.00)
LDL Cholesterol: 149 mg/dL — ABNORMAL HIGH (ref 0–99)
NonHDL: 170.6
Total CHOL/HDL Ratio: 4
Triglycerides: 108 mg/dL (ref 0.0–149.0)
VLDL: 21.6 mg/dL (ref 0.0–40.0)

## 2022-09-20 LAB — COMPREHENSIVE METABOLIC PANEL
ALT: 18 U/L (ref 0–35)
AST: 15 U/L (ref 0–37)
Albumin: 4.4 g/dL (ref 3.5–5.2)
Alkaline Phosphatase: 59 U/L (ref 39–117)
BUN: 16 mg/dL (ref 6–23)
CO2: 28 meq/L (ref 19–32)
Calcium: 9.6 mg/dL (ref 8.4–10.5)
Chloride: 99 mEq/L (ref 96–112)
Creatinine, Ser: 0.95 mg/dL (ref 0.40–1.20)
GFR: 72.27 mL/min (ref >60.00)
Glucose, Bld: 90 mg/dL (ref 70–99)
Potassium: 4.2 meq/L (ref 3.5–5.1)
Sodium: 136 meq/L (ref 135–145)
Total Bilirubin: 0.7 mg/dL (ref 0.2–1.2)
Total Protein: 6.5 g/dL (ref 6.0–8.3)

## 2022-09-20 LAB — URINALYSIS, ROUTINE W REFLEX MICROSCOPIC
Bilirubin Urine: NEGATIVE
Hgb urine dipstick: NEGATIVE
Ketones, ur: NEGATIVE
Leukocytes,Ua: NEGATIVE
Nitrite: NEGATIVE
Specific Gravity, Urine: <=1.005 — AB (ref 1.000–1.030)
Total Protein, Urine: NEGATIVE
Urine Glucose: NEGATIVE
Urobilinogen, UA: 0.2 (ref 0.0–1.0)
WBC, UA: NONE SEEN (ref 0–?)
pH: 6 (ref 5.0–8.0)

## 2022-09-20 LAB — CBC
HCT: 42.1 % (ref 36.0–46.0)
Hemoglobin: 14.1 g/dL (ref 12.0–15.0)
MCHC: 33.5 g/dL (ref 30.0–36.0)
MCV: 95 fl (ref 78.0–100.0)
Platelets: 171 10*3/uL (ref 150.0–400.0)
RBC: 4.43 Mil/uL (ref 3.87–5.11)
RDW: 13 % (ref 11.5–15.5)
WBC: 3.6 10*3/uL — ABNORMAL LOW (ref 4.0–10.5)

## 2022-09-20 LAB — TSH: TSH: 1.6 u[IU]/mL (ref 0.35–5.50)

## 2022-09-20 MED ORDER — TRAZODONE HCL 50 MG PO TABS
50.0000 mg | ORAL_TABLET | Freq: Every evening | ORAL | 1 refills | Status: DC | PRN
Start: 2022-09-20 — End: 2022-10-10

## 2022-09-20 MED ORDER — FLUOXETINE HCL 20 MG PO CAPS
20.0000 mg | ORAL_CAPSULE | Freq: Every day | ORAL | 3 refills | Status: DC
Start: 2022-09-20 — End: 2022-10-10

## 2022-09-20 NOTE — Progress Notes (Signed)
Established Patient Office Visit   Subjective:  Patient ID: Sharon Carey, female    DOB: 17-Mar-1975  Age: 47 y.o. MRN: [id number]  Chief Complaint  Patient presents with   Medical Management of Chronic Issues    6 month follow up. Pt is fasting. Had pneumonia and now recovering.     HPI Encounter Diagnoses  Name Primary?   Healthcare maintenance Yes   Depression with anxiety    Screening for diabetes mellitus    Elevated LDL cholesterol level    For yearly health check and physical.  Status post recent URI that led to a right lower lobe pneumonia and severe reactive airway disease.  She is finally recovering.  Minimal cough without wheezing.  Continues to do well with Prozac for anxiety and depression.  Uses trazodone as needed for sleep.  Continues to be active physically by walking.  She does have regular dental care.  Has found a position in a much less stressful job.  She will be joining [employer].  Home life is good..  Kids are doing well.   Review of Systems  Constitutional: Negative.   HENT: Negative.    Eyes:  Negative for blurred vision, discharge and redness.  Respiratory: Negative.    Cardiovascular: Negative.   Gastrointestinal:  Negative for abdominal pain.  Genitourinary: Negative.   Musculoskeletal: Negative.  Negative for myalgias.  Skin:  Negative for rash.  Neurological:  Negative for tingling, loss of consciousness and weakness.  Endo/Heme/Allergies:  Negative for polydipsia.     Current Outpatient Medications:    acetaZOLAMIDE (DIAMOX) 250 MG tablet, Take 1 tablet (250 mg total) by mouth 3 (three) times daily as needed., Disp: 90 tablet, Rfl: 4   albuterol (VENTOLIN HFA) 108 (90 Base) MCG/ACT inhaler, Inhale 1-2 puffs into the lungs every 6 (six) hours as needed for wheezing or shortness of breath., Disp: 18 g, Rfl: 0   bimatoprost (LATISSE) 0.03 % ophthalmic solution, Place 1 drop into both eyes at bedtime., Disp: , Rfl:    Biotin 1 MG CAPS, biotin,  Disp: , Rfl:    Cholecalciferol (VITAMIN D) 125 MCG (5000 UT) CAPS, Vitamin D, Disp: , Rfl:    fluticasone (FLONASE) 50 MCG/ACT nasal spray, PLACE 2 SPRAYS INTO BOTH NOSTRILS DAILY, Disp: 16 g, Rfl: 0   levonorgestrel (MIRENA) 20 MCG/24HR IUD, 1 each by Intrauterine route once., Disp: , Rfl:    methocarbamol (ROBAXIN) 500 MG tablet, Take 1 tablet (500 mg total) by mouth every 8 (eight) hours as needed for muscle spasms., Disp: 60 tablet, Rfl: 0   FLUoxetine (PROZAC) 20 MG capsule, Take 1 capsule (20 mg total) by mouth daily., Disp: 90 capsule, Rfl: 3   traZODone (DESYREL) 50 MG tablet, Take 1 tablet (50 mg total) by mouth at bedtime as needed for sleep., Disp: 90 tablet, Rfl: 1   Objective:     BP 124/70   Pulse 80   Temp 97.9 F (36.6 C)   Ht 5\' 4"  (1.626 m)   Wt 160 lb (72.6 kg)   SpO2 97%   BMI 27.46 kg/m    Physical Exam Constitutional:      General: She is not in acute distress.    Appearance: Normal appearance. She is not ill-appearing, toxic-appearing or diaphoretic.  HENT:     Head: Normocephalic and atraumatic.     Right Ear: Tympanic membrane, ear canal and external ear normal.     Left Ear: Tympanic membrane, ear canal and external ear normal.  Mouth/Throat:     Mouth: Mucous membranes are moist.     Pharynx: Oropharynx is clear. No oropharyngeal exudate or posterior oropharyngeal erythema.  Eyes:     General: No scleral icterus.       Right eye: No discharge.        Left eye: No discharge.     Extraocular Movements: Extraocular movements intact.     Conjunctiva/sclera: Conjunctivae normal.     Pupils: Pupils are equal, round, and reactive to light.  Cardiovascular:     Rate and Rhythm: Normal rate and regular rhythm.  Pulmonary:     Effort: Pulmonary effort is normal. No respiratory distress.     Breath sounds: Normal breath sounds. No wheezing, rhonchi or rales.  Abdominal:     General: Bowel sounds are normal.  Musculoskeletal:     Cervical back: No  rigidity or tenderness.  Skin:    General: Skin is warm and dry.  Neurological:     Mental Status: She is alert and oriented to person, place, and time.  Psychiatric:        Mood and Affect: Mood normal.        Behavior: Behavior normal.      No results found for any visits on 09/20/22.    The 10-year ASCVD risk score (Arnett DK, et al., 2019) is: 0.6%    Assessment & Plan:   Healthcare maintenance -     CBC -     Comprehensive metabolic panel -     Urinalysis, Routine w reflex microscopic  Depression with anxiety -     TSH -     FLUoxetine HCl; Take 1 capsule (20 mg total) by mouth daily.  Dispense: 90 capsule; Refill: 3 -     traZODone HCl; Take 1 tablet (50 mg total) by mouth at bedtime as needed for sleep.  Dispense: 90 tablet; Refill: 1  Screening for diabetes mellitus -     Hemoglobin A1c  Elevated LDL cholesterol level -     Comprehensive metabolic panel -     Lipid panel    Return in about 1 year (around 09/20/2023), or if symptoms worsen or fail to improve.  Continue daily fluoxetine and trazodone as needed.  Information given on health maintenance and disease prevention.  Mliss Sax, MD

## 2022-09-23 LAB — HEMOGLOBIN A1C: Hgb A1c MFr Bld: 5.6 % (ref 4.6–6.5)

## 2022-10-10 ENCOUNTER — Other Ambulatory Visit: Payer: Self-pay | Admitting: Family Medicine

## 2022-10-10 DIAGNOSIS — F418 Other specified anxiety disorders: Secondary | ICD-10-CM

## 2023-10-17 ENCOUNTER — Other Ambulatory Visit: Payer: Self-pay

## 2023-10-17 ENCOUNTER — Other Ambulatory Visit (HOSPITAL_COMMUNITY): Payer: Self-pay

## 2023-10-17 MED ORDER — COVID-19 MRNA VAC-TRIS(PFIZER) 30 MCG/0.3ML IM SUSY
0.3000 mL | PREFILLED_SYRINGE | Freq: Once | INTRAMUSCULAR | 0 refills | Status: AC
Start: 2023-10-17 — End: 2023-10-18

## 2024-02-25 ENCOUNTER — Encounter (HOSPITAL_BASED_OUTPATIENT_CLINIC_OR_DEPARTMENT_OTHER): Payer: Self-pay | Admitting: Emergency Medicine

## 2024-02-25 ENCOUNTER — Other Ambulatory Visit: Payer: Self-pay

## 2024-02-25 ENCOUNTER — Emergency Department (HOSPITAL_BASED_OUTPATIENT_CLINIC_OR_DEPARTMENT_OTHER)
Admission: EM | Admit: 2024-02-25 | Discharge: 2024-02-26 | Disposition: A | Discharge status: Home / Self Care | Attending: Emergency Medicine | Admitting: Emergency Medicine

## 2024-02-25 ENCOUNTER — Emergency Department (HOSPITAL_BASED_OUTPATIENT_CLINIC_OR_DEPARTMENT_OTHER)

## 2024-02-25 DIAGNOSIS — S62625A Displaced fracture of medial phalanx of left ring finger, initial encounter for closed fracture: Secondary | ICD-10-CM | POA: Insufficient documentation

## 2024-02-25 DIAGNOSIS — M79645 Pain in left finger(s): Secondary | ICD-10-CM | POA: Diagnosis present

## 2024-02-25 DIAGNOSIS — W010XXA Fall on same level from slipping, tripping and stumbling without subsequent striking against object, initial encounter: Secondary | ICD-10-CM | POA: Diagnosis not present

## 2024-02-25 MED ORDER — LIDOCAINE HCL 2 % IJ SOLN
20.0000 mL | Freq: Once | INTRAMUSCULAR | Status: AC
  Administered 2024-02-25: 400 mg via INTRADERMAL
  Filled 2024-02-25: qty 20

## 2024-02-25 MED ORDER — LIDOCAINE HCL 2 % IJ SOLN
3.0000 mg | INTRAMUSCULAR | Status: DC
  Administered 2024-02-25: 3 mg

## 2024-02-25 NOTE — ED Provider Notes (Signed)
 " [facility] Provider Note   CSN: [id number] Arrival date & time: 02/25/24  2116     Patient presents with: Finger Injury   Sharon Carey is a 49 y.o. female.   Patient presents to the emergency department today for evaluation of left ring finger pain.  Patient had a trip and fall around 6 PM.  She has had pain and swelling of her finger.  She presents tonight because she has 2 rings on the finger which have become stuck.  She has general tenderness and swelling of the digit especially over the middle phalanx.  Patient and husband tried to remove ring at home without success.       Prior to Admission medications  Medication Sig Start Date End Date Taking? Authorizing Provider  acetaZOLAMIDE  (DIAMOX ) 250 MG tablet Take 1 tablet (250 mg total) by mouth 3 (three) times daily as needed. 02/16/18   Ines Onetha NOVAK, MD  albuterol  (VENTOLIN  HFA) 108 (90 Base) MCG/ACT inhaler Inhale 1-2 puffs into the lungs every 6 (six) hours as needed for wheezing or shortness of breath. 12/14/20   Berneta Elsie Sayre, MD  bimatoprost (LATISSE) 0.03 % ophthalmic solution Place 1 drop into both eyes at bedtime.    [provider]  Biotin 1 MG CAPS biotin    [provider]  Cholecalciferol (VITAMIN D) 125 MCG (5000 UT) CAPS Vitamin D    [provider]  FLUoxetine  (PROZAC ) 20 MG capsule TAKE ONE CAPSULE BY MOUTH DAILY 10/10/22   Berneta Elsie Sayre, MD  fluticasone  (FLONASE ) 50 MCG/ACT nasal spray PLACE 2 SPRAYS INTO BOTH NOSTRILS DAILY 09/03/18   Berneta Elsie Sayre, MD  levonorgestrel  (MIRENA ) 20 MCG/24HR IUD 1 each by Intrauterine route once.    [provider]  methocarbamol  (ROBAXIN ) 500 MG tablet Take 1 tablet (500 mg total) by mouth every 8 (eight) hours as needed for muscle spasms. 03/22/22   Berneta Elsie Sayre, MD  traZODone  (DESYREL ) 50 MG tablet TAKE ONE TABLET BY MOUTH EVERY NIGHT AT BEDTIME 10/10/22    Berneta Elsie Sayre, MD    Allergies: Amoxicillin    Review of Systems  Updated Vital Signs BP (!) 108/92 (BP Location: Right Arm)   Pulse 78   Temp 97.9 F (36.6 C)   Resp 16   Ht 5' 4 (1.626 m)   Wt 78.5 kg   SpO2 97%   BMI 29.70 kg/m   Physical Exam Vitals and nursing note reviewed.  Constitutional:      Appearance: She is well-developed.  HENT:     Head: Normocephalic and atraumatic.  Eyes:     Pupils: Pupils are equal, round, and reactive to light.  Cardiovascular:     Pulses: Normal pulses. No decreased pulses.  Musculoskeletal:        General: Tenderness present.     Cervical back: Normal range of motion and neck supple.     Comments: Left ring finger: There is swelling proximal to the DIP joint.  Patient has a engagement ring and wedding band at the base of the digit.  I am able to turn these but they are unable to be removed due to significant swelling.  There is some mild ecchymosis noted at the base of the finger.  Distal sensation intact.  Capillary refill less than 2 seconds.  Skin:    General: Skin is warm and dry.  Neurological:     Mental Status: She is alert.  Sensory: No sensory deficit.     Comments: Motor, sensation, and vascular distal to the injury is fully intact.   Psychiatric:        Mood and Affect: Mood normal.     (all labs ordered are listed, but only abnormal results are displayed) Labs Reviewed - No data to display  EKG: None  Radiology: DG Finger Ring Left Result Date: 02/25/2024 EXAM: 3 VIEW(S) XRAY OF THE FINGER(S) 02/25/2024 09:53:02 PM COMPARISON: None available. CLINICAL HISTORY: swelling swelling swelling swelling swelling FINDINGS: BONES AND JOINTS: There is a nondisplaced fracture of the ventral base of the 4th middle phalanx with intraarticular extension. No dislocation. Old trauma involving the proximal portion of the fourth middle phalanx with remodeling. SOFT TISSUES: Soft tissue swelling. IMPRESSION: 1.  Nondisplaced fracture of the base of the 4th middle phalanx. 2. Sequelae of remote trauma involving the proximal portion of the 4th middle phalanx with remodeling. 3. Soft tissue swelling. Electronically signed by: Greig Pique MD 02/25/2024 09:57 PM EST RP Workstation: [id number]     .Nerve Block  Date/Time: 02/25/2024 11:48 PM  Performed by: Desiderio Chew, PA-C Authorized by: Desiderio Chew, PA-C   Consent:    Consent obtained:  Verbal   Consent given by:  Patient   Risks, benefits, and alternatives were discussed: yes     Risks discussed:  Bleeding, pain and unsuccessful block Universal protocol:    Patient identity confirmed:  Verbally with patient and provided demographic data Indications:    Indications:  Pain relief Location:    Body area:  Upper extremity   Laterality:  Left Pre-procedure details:    Skin preparation:  Povidone-iodine Skin anesthesia:    Skin anesthesia method:  None Procedure details:    Block needle gauge:  25 G   Anesthetic injected:  3 mg lidocaine  2 % Post-procedure details:    Outcome:  Anesthesia achieved   Procedure completion:  Tolerated well, no immediate complications Comments:     3 sided ring block performed of the left ring finger.  Adequate anesthesia obtained.  Rings were removed using 3-0 silk suture wrapped around the digit.    Medications Ordered in the ED  lidocaine  (XYLOCAINE ) 2 % (with pres) injection 400 mg (400 mg Intradermal Given 02/25/24 2227)   ED Course  Patient seen and examined. History obtained directly from patient. Work-up including labs, imaging, EKG ordered in triage, if performed, were reviewed.    Labs/EKG: None ordered  Imaging: Independently visualized and interpreted.  This included: X-ray of the finger shows base of the middle phalanx fracture, also old fracture of the distal portion of the middle phalanx.  Medications/Fluids: Ordered: Lidocaine  2% without epinephrine  Most recent vital signs reviewed  and are as follows: BP (!) 108/92 (BP Location: Right Arm)   Pulse 78   Temp 97.9 F (36.6 C)   Resp 16   Ht 5' 4 (1.626 m)   Wt 78.5 kg   SpO2 97%   BMI 29.70 kg/m   Initial impression: Left ring finger fracture, 2 stuck rings without vascular compromise, however given recent injury, concern that this may progress with increasing swelling.  Discussed attempted ring removal with patient.  Discussed digital block.  She agrees to proceed.  11:47 PM Reassessment performed. Patient appears stable.   Ring block performed.   I was able to remove both rings using a 3-0 silk suture, wrapped around the finger repetitively to control swelling.  There was a small nick in the skin with passage  of the needle, bleeding controlled and cleaned with skin cleanser.  Patient had some discomfort, but this was successful ultimately.  Most current vital signs reviewed and are as follows: BP (!) 108/92 (BP Location: Right Arm)   Pulse 78   Temp 97.9 F (36.6 C)   Resp 16   Ht 5' 4 (1.626 m)   Wt 78.5 kg   SpO2 97%   BMI 29.70 kg/m   Plan: Discharge to home.  Will place finger splint.  Prescriptions written for: None  Other home care instructions discussed: Discussed elevation, ice, OTC meds for pain.  ED return instructions discussed: Return with worsening pain, redness, swelling.  Follow-up instructions discussed: Patient encouraged to follow-up with their orthopedist or orthopedic referral in the next 1 week.                                   Medical Decision Making Amount and/or Complexity of Data Reviewed Radiology: ordered.  Risk Prescription drug management.   Patient with finger fracture, do not suspect significant flexor tendon disruption.  She will need finger splint and orthopedic follow-up.  Stuck rings were removed as above.  Digital block performed prior to removal.     Final diagnoses:  Closed displaced fracture of middle phalanx of left ring finger, initial  encounter    ED Discharge Orders     None          Desiderio Chew, PA-C 02/25/24 2350  "

## 2024-02-25 NOTE — Discharge Instructions (Signed)
 Please read and follow all provided instructions.  Your diagnoses today include:  1. Closed displaced fracture of middle phalanx of left ring finger, initial encounter     Tests performed today include: X-ray shows a fracture of the base of the middle phalanx of the ring finger, you have an old fracture of the middle phalanx distally Vital signs. See below for your results today.   Medications prescribed:  Please use over-the-counter NSAID medications (ibuprofen, naproxen) or Tylenol (acetaminophen) as directed on the packaging for pain -- as long as you do not have any reasons avoid these medications. Reasons to avoid NSAID medications include: weak kidneys, a history of bleeding in your stomach or gut, or uncontrolled high blood pressure or previous heart attack. Reasons to avoid Tylenol include: liver problems or ongoing alcohol use. Never take more than 4000mg  or 8 Extra strength Tylenol in a 24 hour period.     Take any prescribed medications only as directed.  Home care instructions:  Follow any educational materials contained in this packet Follow R.I.C.E. Protocol: R - rest your injury  I  - use ice on injury without applying directly to skin C - compress injury with bandage or splint E - elevate the injury as much as possible  Follow-up instructions: Please follow-up with your primary care provider or the provided orthopedic physician (bone specialist) in 1 week.   Return instructions:  Please return if your fingers are numb or tingling, appear gray or blue, or you have severe pain (also elevate the arm and loosen splint or wrap if you were given one) Please return to the Emergency Department if you experience worsening symptoms.  Please return if you have any other emergent concerns.  Additional Information:  Your vital signs today were: BP (!) 108/92 (BP Location: Right Arm)   Pulse 78   Temp 97.9 F (36.6 C)   Resp 16   Ht 5' 4 (1.626 m)   Wt 78.5 kg   SpO2 97%    BMI 29.70 kg/m  If your blood pressure (BP) was elevated above 135/85 this visit, please have this repeated by your doctor within one month. --------------

## 2024-02-25 NOTE — ED Triage Notes (Signed)
 Pt L ring finger pain and swelling since appx 1800 today, after sustaining mechanical fall.
